# Patient Record
Sex: Male | Born: 1985 | Race: Asian | Hispanic: No | Marital: Single | Smoking: Never smoker
Health system: Southern US, Community
[De-identification: ages and names within clinical notes are randomized; demographics above are authoritative.]

## PROBLEM LIST (undated history)

## (undated) DIAGNOSIS — R55 Syncope and collapse: Secondary | ICD-10-CM

## (undated) HISTORY — DX: Syncope and collapse: R55

## (undated) HISTORY — PX: TYMPANOPLASTY: SHX33

---

## 2010-11-13 ENCOUNTER — Encounter: Payer: Self-pay | Admitting: Internal Medicine

## 2010-11-13 ENCOUNTER — Ambulatory Visit (INDEPENDENT_AMBULATORY_CARE_PROVIDER_SITE_OTHER): Payer: 59 | Admitting: Internal Medicine

## 2010-11-13 DIAGNOSIS — R0609 Other forms of dyspnea: Secondary | ICD-10-CM

## 2010-11-13 DIAGNOSIS — R0989 Other specified symptoms and signs involving the circulatory and respiratory systems: Secondary | ICD-10-CM

## 2010-11-13 DIAGNOSIS — K219 Gastro-esophageal reflux disease without esophagitis: Secondary | ICD-10-CM

## 2010-11-13 DIAGNOSIS — R5383 Other fatigue: Secondary | ICD-10-CM | POA: Insufficient documentation

## 2010-11-13 DIAGNOSIS — R0600 Dyspnea, unspecified: Secondary | ICD-10-CM | POA: Insufficient documentation

## 2010-11-13 LAB — CBC WITH DIFFERENTIAL/PLATELET
Basophils Absolute: 0 10*3/uL (ref 0.0–0.1)
Basophils Relative: 1 % (ref 0–1)
Eosinophils Absolute: 0.1 10*3/uL (ref 0.0–0.7)
MCH: 29.4 pg (ref 26.0–34.0)
MCHC: 34.1 g/dL (ref 30.0–36.0)
Neutro Abs: 3.3 10*3/uL (ref 1.7–7.7)
Neutrophils Relative %: 62 % (ref 43–77)
Platelets: 283 10*3/uL (ref 150–400)
RDW: 12 % (ref 11.5–15.5)

## 2010-11-13 LAB — HEPATIC FUNCTION PANEL
AST: 21 U/L (ref 0–37)
Albumin: 5 g/dL (ref 3.5–5.2)
Total Bilirubin: 0.6 mg/dL (ref 0.3–1.2)

## 2010-11-13 LAB — BASIC METABOLIC PANEL
BUN: 15 mg/dL (ref 6–23)
CO2: 30 mEq/L (ref 19–32)
Calcium: 9.5 mg/dL (ref 8.4–10.5)
Chloride: 101 mEq/L (ref 96–112)
Creat: 1 mg/dL (ref 0.50–1.35)

## 2010-11-13 MED ORDER — OMEPRAZOLE 40 MG PO CPDR: 40.0000 mg | DELAYED_RELEASE_CAPSULE | Freq: Every day | ORAL | Status: DC

## 2010-11-13 NOTE — Assessment & Plan Note (Signed)
Obtain chest x ray
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
## 2010-11-13 NOTE — Assessment & Plan Note (Signed)
Obtain CBC, Chem-7, LFT and TSH

## 2010-11-13 NOTE — Assessment & Plan Note (Signed)
Begin omeprazole 40 mg nightly. Limit caffeine intake. Followup in three weeks or sooner if necessary.

## 2010-11-16 ENCOUNTER — Telehealth: Payer: Self-pay

## 2010-11-16 NOTE — Telephone Encounter (Signed)
Message copied by Beverely Low on Mon Nov 16, 2010  4:32 PM ------      Message from: Staci Righter      Created: Sat Nov 14, 2010  7:20 AM       Labs nl

## 2010-11-16 NOTE — Telephone Encounter (Signed)
Pt.notified

## 2010-12-04 ENCOUNTER — Ambulatory Visit: Payer: 59 | Admitting: Internal Medicine

## 2014-04-10 ENCOUNTER — Ambulatory Visit (INDEPENDENT_AMBULATORY_CARE_PROVIDER_SITE_OTHER): Payer: Self-pay | Admitting: Emergency Medicine

## 2014-04-10 VITALS — BP 122/74 | HR 113 | Temp 98.0°F | Resp 17 | Ht 60.0 in | Wt 116.0 lb

## 2014-04-10 DIAGNOSIS — J018 Other acute sinusitis: Secondary | ICD-10-CM

## 2014-04-10 MED ORDER — PSEUDOEPHEDRINE-GUAIFENESIN ER 60-600 MG PO TB12: 1.0000 | ORAL_TABLET | Freq: Two times a day (BID) | ORAL | Status: DC

## 2014-04-10 MED ORDER — AMOXICILLIN-POT CLAVULANATE 875-125 MG PO TABS: 1.0000 | ORAL_TABLET | Freq: Two times a day (BID) | ORAL | Status: DC

## 2014-04-10 NOTE — Progress Notes (Signed)
[facility] [address], [place] [id number] [phone]  Date:  04/10/2014   Name:  Keith Hines   DOB:  1986-01-24   MRN:  [id number]  PCP:  Letitia LibraHodgin Jr, Ala Dachhomas Whitson, MD    Chief Complaint: Chills, fever last night, Headache and Sinusitis   History of Present Illness:  Keith MoSdon Dearmas is a 28 y.o. very pleasant male patient who presents with the following:  3 days history of nasal congestion and drainage.  Post nasal purulent drainage.  Pain in frontal regions. Sore throat.  No cough, wheezing or shortness of breath. Ears feel stuffy and has pain. No  Chills but fever and night sweats Developed headache in right parietal region when sinus symptoms began No neuro or visual symptoms.  Headache is pulsating and interferes with sleep. Feels "bad" No improvement with over the counter medications or other home remedies.  Denies other complaint or health concern today.    Patient Active Problem List   Diagnosis Date Noted  . GERD (gastroesophageal reflux disease) 11/13/2010  . Fatigue 11/13/2010  . Dyspnea 11/13/2010    Past Medical History  Diagnosis Date  . Syncope     History reviewed. No pertinent past surgical history.  History  Substance Use Topics  . Smoking status: Never Smoker   . Smokeless tobacco: Not on file  . Alcohol Use: No    Family History  Problem Relation Age of Onset  . Arthritis Mother   . Arthritis Father   . Hyperlipidemia Father     No Known Allergies  Medication list has been reviewed and updated.  Current Outpatient Prescriptions on File Prior to Visit  Medication Sig Dispense Refill  . Cholecalciferol (VITAMIN D) 1000 UNITS capsule Take 1,000 Units by mouth daily.        . fish oil-omega-3 fatty acids 1000 MG capsule Take 1 g by mouth daily.         No current facility-administered medications on file prior to visit.    Review of Systems:  As per HPI, otherwise negative.    Physical Examination: Filed  Vitals:   04/10/14 1130  BP: 122/74  Pulse: 113  Temp: 98 F (36.7 C)  Resp: 17   Filed Vitals:   04/10/14 1130  Height: 5' (1.524 m)  Weight: 116 lb (52.617 kg)   Body mass index is 22.65 kg/(m^2). Ideal Body Weight: Weight in (lb) to have BMI = 25: 127.7  GEN: WDWN, NAD, Non-toxic, A & O x 3 HEENT: Atraumatic, Normocephalic. Neck supple. No masses, No LAD. Ears and Nose: No external deformity. CV: RRR, No M/G/R. No JVD. No thrill. No extra heart sounds. PULM: CTA B, no wheezes, crackles, rhonchi. No retractions. No resp. distress. No accessory muscle use. ABD: S, NT, ND, +BS. No rebound. No HSM. EXTR: No c/c/e NEURO Normal gait.  PSYCH: Normally interactive. Conversant. Not depressed or anxious appearing.  Calm demeanor.    Assessment and Plan: Sinusitis augmentin mucinex d  Signed,  Phillips OdorJeffery Josep Luviano, MD

## 2014-04-10 NOTE — Patient Instructions (Signed)

## 2014-04-12 ENCOUNTER — Emergency Department (HOSPITAL_COMMUNITY)
Admission: EM | Admit: 2014-04-12 | Discharge: 2014-04-12 | Disposition: A | Discharge status: Home / Self Care | Payer: Commercial Indemnity | Attending: Emergency Medicine | Admitting: Emergency Medicine

## 2014-04-12 ENCOUNTER — Encounter (HOSPITAL_COMMUNITY): Payer: Self-pay | Admitting: Emergency Medicine

## 2014-04-12 DIAGNOSIS — R000 Tachycardia, unspecified: Secondary | ICD-10-CM | POA: Diagnosis not present

## 2014-04-12 DIAGNOSIS — R0981 Nasal congestion: Secondary | ICD-10-CM

## 2014-04-12 DIAGNOSIS — R51 Headache: Secondary | ICD-10-CM | POA: Diagnosis not present

## 2014-04-12 DIAGNOSIS — Z792 Long term (current) use of antibiotics: Secondary | ICD-10-CM | POA: Diagnosis not present

## 2014-04-12 DIAGNOSIS — H9209 Otalgia, unspecified ear: Secondary | ICD-10-CM | POA: Insufficient documentation

## 2014-04-12 DIAGNOSIS — J069 Acute upper respiratory infection, unspecified: Secondary | ICD-10-CM | POA: Diagnosis not present

## 2014-04-12 DIAGNOSIS — R61 Generalized hyperhidrosis: Secondary | ICD-10-CM | POA: Diagnosis not present

## 2014-04-12 DIAGNOSIS — R519 Headache, unspecified: Secondary | ICD-10-CM

## 2014-04-12 DIAGNOSIS — R630 Anorexia: Secondary | ICD-10-CM | POA: Insufficient documentation

## 2014-04-12 DIAGNOSIS — Z7952 Long term (current) use of systemic steroids: Secondary | ICD-10-CM | POA: Diagnosis not present

## 2014-04-12 DIAGNOSIS — Z791 Long term (current) use of non-steroidal anti-inflammatories (NSAID): Secondary | ICD-10-CM | POA: Diagnosis not present

## 2014-04-12 MED ORDER — NAPROXEN 500 MG PO TABS: 500.0000 mg | ORAL_TABLET | Freq: Two times a day (BID) | ORAL | Status: DC

## 2014-04-12 MED ORDER — NAPROXEN 500 MG PO TABS
500.0000 mg | ORAL_TABLET | Freq: Once | ORAL | Status: AC
  Administered 2014-04-12: 500 mg via ORAL
  Filled 2014-04-12: qty 1

## 2014-04-12 MED ORDER — SALINE SPRAY 0.65 % NA SOLN
1.0000 | NASAL | Status: DC | PRN
  Administered 2014-04-12: 1 via NASAL
  Filled 2014-04-12: qty 44

## 2014-04-12 NOTE — ED Provider Notes (Signed)
Medical screening examination/treatment/procedure(s) were performed by non-physician practitioner and as supervising physician I was immediately available for consultation/collaboration.   EKG Interpretation None       Olivia Mackielga M Lamir Racca, MD 04/12/14 2101

## 2014-04-12 NOTE — ED Notes (Signed)
Patient reports he started feeling badly on Monday, states he almost passed out, patient went to UC on Wednesday, was diagnosed with sinus infection and started amoxicillin and mucinex. Pt states he was told to come back if he wasn't feeling better. Patient states he has been having a fever (100.3 at home) patient states at 0340 he took ibuprofen PM. Patient is afebrile on arrival to ER. Patient c/o headache, states he feels hot, and has increased pain in his head when he swallows. Patient reports blood streaked mucous when he blows his nose and decreased hearing "it feels like I am underwater", and difficulty sleeping.

## 2014-04-12 NOTE — ED Provider Notes (Signed)
CSN: [id number]     Arrival date & time 04/12/14  0444 History   First MD Initiated Contact with Patient 04/12/14 [phone]     Chief Complaint  Patient presents with  . URI    fever, headache, chest congestion     (Consider location/radiation/quality/duration/timing/severity/associated sxs/prior Treatment) HPI Pt is a 28yo male presenting to ED with c/o headache, nasal congestion with blood streaked mucous, sore throat, and bilateral ear pressure.  Pt states he started to feel bad about 4 days ago, was seen by UC on Wednesday, 10/28, dx with sinusitis and discharged home with augmentin and mucinex D.  Pt states he has been taking his medications as prescribed however, he still has a moderately severe, intermittent, frontal headache that is worse at night and worse when he tries to swallow. He was advised by UC to come back if he wasn't feeling better.  Pt reports fever Tmax 100.3 just PTA.  States he took ibuprofen PM which does provide moderate relief for his headaches.  Does c/o mild sore throat. Denies SOB, chest pain or cough.  No hx of asthma.   Past Medical History  Diagnosis Date  . Syncope    Past Surgical History  Procedure Laterality Date  . Tympanoplasty     Family History  Problem Relation Age of Onset  . Arthritis Mother   . Arthritis Father   . Hyperlipidemia Father    History  Substance Use Topics  . Smoking status: Never Smoker   . Smokeless tobacco: Not on file  . Alcohol Use: No    Review of Systems  Constitutional: Positive for fever, chills, diaphoresis and appetite change.  HENT: Positive for congestion, ear pain, rhinorrhea, sinus pressure and sore throat. Negative for trouble swallowing and voice change.   Respiratory: Negative for cough and shortness of breath.   Cardiovascular: Negative for chest pain and palpitations.  Gastrointestinal: Negative for nausea, vomiting, abdominal pain and diarrhea.  All other systems reviewed and are  negative.     Allergies  Review of patient's allergies indicates no known allergies.  Home Medications   Prior to Admission medications   Medication Sig Start Date End Date Taking? Authorizing Provider  amoxicillin-clavulanate (AUGMENTIN) 875-125 MG per tablet Take 1 tablet by mouth 2 (two) times daily. 04/10/14  Yes Carmelina DaneJeffery S Anderson, MD  fluticasone (FLONASE) 50 MCG/ACT nasal spray Place 1 spray into both nostrils daily as needed for allergies or rhinitis.   Yes Historical Provider, MD  Ibuprofen-Diphenhydramine Cit (IBUPROFEN PM) 200-38 MG TABS Take 1 tablet by mouth at bedtime as needed (sleep/ pain).   Yes Historical Provider, MD  Multiple Vitamin (MULTIVITAMIN WITH MINERALS) TABS tablet Take 1 tablet by mouth daily.   Yes Historical Provider, MD  pseudoephedrine-guaifenesin (MUCINEX D) 60-600 MG per tablet Take 1 tablet by mouth every 12 (twelve) hours. 04/10/14 04/10/15 Yes Carmelina DaneJeffery S Anderson, MD  naproxen (NAPROSYN) 500 MG tablet Take 1 tablet (500 mg total) by mouth 2 (two) times daily. 04/12/14   Junius FinnerErin O'Malley, PA-C   BP 109/64  Pulse 92  Temp(Src) 98.7 F (37.1 C) (Oral)  Resp 16  Ht 5' (1.524 m)  Wt 115 lb (52.164 kg)  BMI 22.46 kg/m2  SpO2 100% Physical Exam  Nursing note and vitals reviewed. Constitutional: He appears well-developed and well-nourished.  Pt sitting in exam bed, NAD. Non-toxic appearing.  HENT:  Head: Normocephalic and atraumatic.  Right Ear: Hearing, tympanic membrane, external ear and ear canal normal.  Left Ear: Hearing, tympanic  membrane, external ear and ear canal normal.  Nose: Right sinus exhibits maxillary sinus tenderness and frontal sinus tenderness. Left sinus exhibits maxillary sinus tenderness and frontal sinus tenderness.  Mouth/Throat: Uvula is midline, oropharynx is clear and moist and mucous membranes are normal.  Nose-dried nasal mucosal with scant bright red blood in left nostril.  Eyes: Conjunctivae are normal. No scleral  icterus.  Neck: Normal range of motion. Neck supple.  No nuchal rigidity or meningeal signs.  Cardiovascular: Regular rhythm and normal heart sounds.   Pt tachycardic per triage notes, regular rate and rhythm on exam.  Pulmonary/Chest: Effort normal and breath sounds normal. No respiratory distress. He has no wheezes. He has no rales. He exhibits no tenderness.  No respiratory distress, able to speak in full sentences w/o difficulty. Lungs: CTAB  Abdominal: Soft. Bowel sounds are normal. He exhibits no distension and no mass. There is no tenderness. There is no rebound and no guarding.  Musculoskeletal: Normal range of motion.  Neurological: He is alert.  Skin: Skin is warm and dry.    ED Course  Procedures (including critical care time) Labs Review Labs Reviewed - No data to display  Imaging Review No results found.   EKG Interpretation None      MDM   Final diagnoses:  Acute nonintractable headache, unspecified headache type  Nasal congestion  URI (upper respiratory infection)    Pt is a 28yo male presenting to ED with c/o not feeling better after started on Augmentin 2 days ago for sinusitis after being seen at an UC.  Pt has been sick for a total of 4 days.  Symptoms likely viral in nature, however, strongly encouraged pt to continue antibiotics as well as get plenty of rest and stay well hydrated. Discussed cool mist humidifier as well as propping himself up on pillows at night to help him sleep. Discussed with pt that he is on the appropriate antibiotic, no CXR indicated at this time as pt denies cough and Lungs: CTAB.  Rx: Ocean Saline nasal spray and naprosyn. Home care instructions provided.   Return precautions provided. Pt verbalized understanding and agreement with tx plan.     Junius FinnerErin O'Malley, PA-C 04/12/14 [phone]

## 2014-07-24 ENCOUNTER — Ambulatory Visit (INDEPENDENT_AMBULATORY_CARE_PROVIDER_SITE_OTHER): Payer: Commercial Indemnity | Admitting: Family Medicine

## 2014-07-24 VITALS — BP 120/84 | HR 74 | Temp 98.4°F | Resp 16 | Ht 60.0 in | Wt 117.6 lb

## 2014-07-24 DIAGNOSIS — K219 Gastro-esophageal reflux disease without esophagitis: Secondary | ICD-10-CM

## 2014-07-24 DIAGNOSIS — H6982 Other specified disorders of Eustachian tube, left ear: Secondary | ICD-10-CM

## 2014-07-24 DIAGNOSIS — R0981 Nasal congestion: Secondary | ICD-10-CM

## 2014-07-24 DIAGNOSIS — H6690 Otitis media, unspecified, unspecified ear: Secondary | ICD-10-CM

## 2014-07-24 DIAGNOSIS — H6502 Acute serous otitis media, left ear: Secondary | ICD-10-CM

## 2014-07-24 DIAGNOSIS — R109 Unspecified abdominal pain: Secondary | ICD-10-CM

## 2014-07-24 LAB — POCT CBC
GRANULOCYTE PERCENT: 52.8 % (ref 37–80)
HCT, POC: 50.4 % (ref 43.5–53.7)
HEMOGLOBIN: 16.2 g/dL (ref 14.1–18.1)
Lymph, poc: 2.4 (ref 0.6–3.4)
MCH: 29.1 pg (ref 27–31.2)
MCHC: 32.1 g/dL (ref 31.8–35.4)
MCV: 90.7 fL (ref 80–97)
MID (CBC): 0.1 (ref 0–0.9)
MPV: 8.7 fL (ref 0–99.8)
PLATELET COUNT, POC: 261 10*3/uL (ref 142–424)
POC Granulocyte: 2.9 (ref 2–6.9)
POC LYMPH PERCENT: 44.7 %L (ref 10–50)
POC MID %: 2.5 % (ref 0–12)
RBC: 5.55 M/uL (ref 4.69–6.13)
RDW, POC: 13.4 %
WBC: 5.4 10*3/uL (ref 4.6–10.2)

## 2014-07-24 MED ORDER — AMOXICILLIN 875 MG PO TABS: 875.0000 mg | ORAL_TABLET | Freq: Two times a day (BID) | ORAL | Status: DC

## 2014-07-24 MED ORDER — FLUTICASONE PROPIONATE 50 MCG/ACT NA SUSP: 2.0000 | Freq: Every day | NASAL | Status: AC

## 2014-07-24 NOTE — Patient Instructions (Signed)
Try flonase nasal spray once per day, over the counter zyrtec or allegra once per day for possible allergies and chronic sinus symptoms. These may help pressure and clicking in ears as well.  Amoxicillin for early ear infection on left ear.  This may also help sinuses. If sinus and ear symptoms not improving in next few weeks - we can refer you to Ear Nose and Throat specialist.   For heartburn - you are taking appropriate dose of omeprazole -  once per day. Avoid foods below that may worsen symptoms. If your heartburn is not improving in next 1 to 2 weeks - return to discuss other options or workup. Return to the clinic or go to the nearest emergency room if any of your symptoms worsen or new symptoms occur.  Food Choices for Gastroesophageal Reflux Disease When you have gastroesophageal reflux disease (GERD), the foods you eat and your eating habits are very important. Choosing the right foods can help ease the discomfort of GERD. WHAT GENERAL GUIDELINES DO I NEED TO FOLLOW?  Choose fruits, vegetables, whole grains, low-fat dairy products, and low-fat meat, fish, and poultry.  Limit fats such as oils, salad dressings, butter, nuts, and avocado.  Keep a food diary to identify foods that cause symptoms.  Avoid foods that cause reflux. These may be different for different people.  Eat frequent small meals instead of three large meals each day.  Eat your meals slowly, in a relaxed setting.  Limit fried foods.  Cook foods using methods other than frying.  Avoid drinking alcohol.  Avoid drinking large amounts of liquids with your meals.  Avoid bending over or lying down until 2-3 hours after eating. WHAT FOODS ARE NOT RECOMMENDED? The following are some foods and drinks that may worsen your symptoms: Vegetables Tomatoes. Tomato juice. Tomato and spaghetti sauce. Chili peppers. Onion and garlic. Horseradish. Fruits Oranges, grapefruit, and lemon (fruit and juice). Meats High-fat  meats, fish, and poultry. This includes hot dogs, ribs, ham, sausage, salami, and bacon. Dairy Whole milk and chocolate milk. Sour cream. Cream. Butter. Ice cream. Cream cheese.  Beverages Coffee and tea, with or without caffeine. Carbonated beverages or energy drinks. Condiments Hot sauce. Barbecue sauce.  Sweets/Desserts Chocolate and cocoa. Donuts. Peppermint and spearmint. Fats and Oils High-fat foods, including [place] fries and potato chips. Other Vinegar. Strong spices, such as black pepper, white pepper, red pepper, cayenne, curry powder, cloves, ginger, and chili powder. The items listed above may not be a complete list of foods and beverages to avoid. Contact your dietitian for more information. Document Released: 05/31/2005 Document Revised: 06/05/2013 Document Reviewed: 04/04/2013 [facility] Patient Information 2015 [place], [place]. This information is not intended to replace advice given to you by your health care provider. Make sure you discuss any questions you have with your health care provider.  Barotitis Media Barotitis media is inflammation of your middle ear. This occurs when the auditory tube (eustachian tube) leading from the back of your nose (nasopharynx) to your eardrum is blocked. This blockage may result from a cold, environmental allergies, or an upper respiratory infection. Unresolved barotitis media may lead to damage or hearing loss (barotrauma), which may become permanent. HOME CARE INSTRUCTIONS   Use medicines as recommended by your health care provider. Over-the-counter medicines will help unblock the canal and can help during times of air travel.  Do not put anything into your ears to clean or unplug them. Eardrops will not be helpful.  Do not swim, dive, or fly until  your health care provider says it is all right to do so. If these activities are necessary, chewing gum with frequent, forceful swallowing may help. It is also helpful to hold your nose and  gently blow to pop your ears for equalizing pressure changes. This forces air into the eustachian tube.  Only take over-the-counter or prescription medicines for pain, discomfort, or fever as directed by your health care provider.  A decongestant may be helpful in decongesting the middle ear and make pressure equalization easier. SEEK MEDICAL CARE IF:  You experience a serious form of dizziness in which you feel as if the room is spinning and you feel nauseated (vertigo).  Your symptoms only involve one ear. SEEK IMMEDIATE MEDICAL CARE IF:   You develop a severe headache, dizziness, or severe ear pain.  You have bloody or pus-like drainage from your ears.  You develop a fever.  Your problems do not improve or become worse. MAKE SURE YOU:   Understand these instructions.  Will watch your condition.  Will get help right away if you are not doing well or get worse. Document Released: 05/28/2000 Document Revised: 03/21/2013 Document Reviewed: 12/26/2012 [facility] Patient Information 2015 [place], [place]. This information is not intended to replace advice given to you by your health care provider. Make sure you discuss any questions you have with your health care provider.

## 2014-07-24 NOTE — Progress Notes (Addendum)
Subjective:    Patient ID: Keith Hines, male    DOB: 15-Dec-1985, 29 y.o.   MRN: [id number]  HPI Chief Complaint  Patient presents with  . Abdominal Pain    x 1 1/2 weeks   . Headache  . Bitter taste in mouth  . Stuffy Nose    " as long as he can rememenber "   This chart was scribed for Meredith StaggersJeffrey Iria Jamerson, MD by Andrew Auaven Small, ED Scribe. This patient was seen in room 1 and the patient's care was started at 8:28 PM.  HPI Comments: Keith MoSdon Hanawalt is a 29 y.o. male who presents to the [facility] complaining of abdominal pain that began 1.5 weeks ago. Pt states he has not been eating well due to abdominal pain. Pt denies drinking alcohol often but states he drank an increased amount of beer 1.5 weeks ago. He has also had some dizziness. Pt began taking acid blocker 3 days ago without improvement. Pt's last normal BM h last b 4 hours ago. Pt states he has some bleeding with wiping, last being 2 days ago. Pt states he has been seen in the past and was told he has an anal fissure. Pt denies hard stools. He takes ibuprofen occasionally to help him sleep.  Pt works at a [employer]. He denies heavy lifting and exercise. Pt denies nausea and emesis.  Pt also c/o a bitter/sour taste in his mouth. Pt states he has been drinking an increased amount of coffee beginning 3 months ago. Pt states he drinks about 1-2 cups daily and if not coffee he drinks tea.   Sinus symptoms- pt reports chronic congestion often. He reports symptoms worsen after developing a HA 1 week ago. Pt has been blowing yellow mucous. Pt began taking allergy medication about 2 days ago. Pt has also had left ear fullness without drainage and loss of hearing. Pt used to use nasal spray once a day in that past. Pt denies fever, cough, sneezing, rhinorrhea and weight loss.   Patient Active Problem List   Diagnosis Date Noted  . GERD (gastroesophageal reflux disease) 11/13/2010  . Fatigue 11/13/2010  . Dyspnea 11/13/2010   Past  Medical History  Diagnosis Date  . Syncope    Past Surgical History  Procedure Laterality Date  . Tympanoplasty     No Known Allergies Prior to Admission medications   Medication Sig Start Date End Date Taking? Authorizing Provider  Ibuprofen-Diphenhydramine Cit (IBUPROFEN PM) 200-38 MG TABS Take 1 tablet by mouth at bedtime as needed (sleep/ pain).   Yes Historical Provider, MD  omeprazole (PRILOSEC) 20 MG capsule Take 20 mg by mouth daily.   Yes Historical Provider, MD  amoxicillin-clavulanate (AUGMENTIN) 875-125 MG per tablet Take 1 tablet by mouth 2 (two) times daily. Patient not taking: Reported on 07/24/2014 04/10/14   Carmelina DaneJeffery S Anderson, MD  fluticasone [facility](FLONASE) 50 MCG/ACT nasal spray Place 1 spray into both nostrils daily as needed for allergies or rhinitis.    Historical Provider, MD  Multiple Vitamin (MULTIVITAMIN WITH MINERALS) TABS tablet Take 1 tablet by mouth daily.    Historical Provider, MD  naproxen (NAPROSYN) 500 MG tablet Take 1 tablet (500 mg total) by mouth 2 (two) times daily. Patient not taking: Reported on 07/24/2014 04/12/14   Junius FinnerErin O'Malley, PA-C  pseudoephedrine-guaifenesin [facility](MUCINEX D) 60-600 MG per tablet Take 1 tablet by mouth every 12 (twelve) hours. Patient not taking: Reported on 07/24/2014 04/10/14 04/10/15  Carmelina DaneJeffery S Anderson, MD  History   Social History  . Marital Status: Single    Spouse Name: N/A  . Number of Children: N/A  . Years of Education: N/A   Occupational History  . Not on file.   Social History Main Topics  . Smoking status: Never Smoker   . Smokeless tobacco: Not on file  . Alcohol Use: No  . Drug Use: No  . Sexual Activity: No   Other Topics Concern  . Not on file   Social History Narrative   Review of Systems  Constitutional: Negative for fever, chills and unexpected weight change.  HENT: Positive for congestion and ear pain. Negative for rhinorrhea, sinus pressure and sneezing.   Gastrointestinal: Positive for abdominal  pain. Negative for nausea and vomiting.  Neurological: Positive for headaches.   Objective:   Physical Exam  Constitutional: He is oriented to person, place, and time. He appears well-developed and well-nourished.  HENT:  Head: Normocephalic and atraumatic.  Right Ear: Tympanic membrane, external ear and ear canal normal.  Left Ear: Tympanic membrane, external ear and ear canal normal.  Nose: Mucosal edema present. No rhinorrhea. Right sinus exhibits no maxillary sinus tenderness and no frontal sinus tenderness. Left sinus exhibits no maxillary sinus tenderness and no frontal sinus tenderness.  Mouth/Throat: Mucous membranes are normal. Posterior oropharyngeal erythema ( minimal) present. No oropharyngeal exudate.  Left TM has some scarring, clear fluid and minimal injection superiorly.Right TM is pearly grey. Edematous turbinates bilaterally.   Eyes: Conjunctivae are normal. Pupils are equal, round, and reactive to light.  Neck: Neck supple.  Cardiovascular: Normal rate, regular rhythm, normal heart sounds and intact distal pulses.   No murmur heard. Pulmonary/Chest: Effort normal and breath sounds normal. He has no wheezes. He has no rhonchi. He has no rales.  Abdominal: Soft. He exhibits no distension. There is no tenderness. There is no tenderness at McBurney's point and negative Murphy's sign.  Lymphadenopathy:    He has no cervical adenopathy.  Neurological: He is alert and oriented to person, place, and time.  Skin: Skin is warm and dry. No rash noted.  Psychiatric: He has a normal mood and affect. His behavior is normal.  Vitals reviewed.  Filed Vitals:   07/24/14 2008  BP: 120/84  Pulse: 74  Temp: 98.4 F (36.9 C)  TempSrc: Oral  Resp: 16  Height: 5' (1.524 m)  Weight: 117 lb 9.6 oz (53.343 kg)  SpO2: 98%   Results for orders placed or performed in visit on 07/24/14  POCT CBC  Result Value Ref Range   WBC 5.4 4.6 - 10.2 K/uL   Lymph, poc 2.4 0.6 - 3.4   POC LYMPH  PERCENT 44.7 10 - 50 %L   MID (cbc) 0.1 0 - 0.9   POC MID % 2.5 0 - 12 %M   POC Granulocyte 2.9 2 - 6.9   Granulocyte percent 52.8 37 - 80 %G   RBC 5.55 4.69 - 6.13 M/uL   Hemoglobin 16.2 14.1 - 18.1 g/dL   HCT, POC 16.1 09.6 - 53.7 %   MCV 90.7 80 - 97 fL   MCH, POC 29.1 27 - 31.2 pg   MCHC 32.1 31.8 - 35.4 g/dL   RDW, POC 04.5 %   Platelet Count, POC 261 142 - 424 K/uL   MPV 8.7 0 - 99.8 fL      Assessment & Plan:   Calyb Mcquarrie is a 29 y.o. male Abdominal pain, unspecified abdominal location, Gastroesophageal reflux disease, esophagitis presence  not specified - Plan: POCT CBC, Comprehensive metabolic panel  -abdomen soft, nontender, reassuring CBC. Will check lipase and CMP, but suspected GERD.  Continue omeprazole 20mg  QD - just started few days ago - and trigger avoidance as below. rtc if persists - sooner if worsens.   Eustachian tube dysfunction, left - Plan: POCT CBC, fluticasone (FLONASE) 50 MCG/ACT nasal spray, Ear infection - serous otitis vs. Early AOM, and underlying sinus congestion - Plan: fluticasone (FLONASE) 50 MCG/ACT nasal spray.   - suspected allergic component with ETD, chronic sinusitis and L sided serous otitis vs early AOM .   -Start flonase NS and saline NS otc - correct use discussed.   -zyrtec or allegra otc qd  -amoxicillin for early AOM and sinusitis with new HA  -rtc precautions and consider ENT eval if persistent sx's.    Meds ordered this encounter  Medications  . omeprazole (PRILOSEC) 20 MG capsule    Sig: Take 20 mg by mouth daily.  . fluticasone (FLONASE) 50 MCG/ACT nasal spray    Sig: Place 2 sprays into both nostrils daily.    Dispense:  16 g    Refill:  3   Patient Instructions  Try flonase nasal spray once per day, over the counter zyrtec or allegra once per day for possible allergies and chronic sinus symptoms. These may help pressure and clicking in ears as well.  Amoxicillin for early ear infection on left ear.  This may also help  sinuses. If sinus and ear symptoms not improving in next few weeks - we can refer you to Ear Nose and Throat specialist.   For heartburn - you are taking appropriate dose of omeprazole - 20mg  once per day. Avoid foods below that may worsen symptoms. If your heartburn is not improving in next 1 to 2 weeks - return to discuss other options or workup. Return to the clinic or go to the nearest emergency room if any of your symptoms worsen or new symptoms occur.  Food Choices for Gastroesophageal Reflux Disease When you have gastroesophageal reflux disease (GERD), the foods you eat and your eating habits are very important. Choosing the right foods can help ease the discomfort of GERD. WHAT GENERAL GUIDELINES DO I NEED TO FOLLOW?  Choose fruits, vegetables, whole grains, low-fat dairy products, and low-fat meat, fish, and poultry.  Limit fats such as oils, salad dressings, butter, nuts, and avocado.  Keep a food diary to identify foods that cause symptoms.  Avoid foods that cause reflux. These may be different for different people.  Eat frequent small meals instead of three large meals each day.  Eat your meals slowly, in a relaxed setting.  Limit fried foods.  Cook foods using methods other than frying.  Avoid drinking alcohol.  Avoid drinking large amounts of liquids with your meals.  Avoid bending over or lying down until 2-3 hours after eating. WHAT FOODS ARE NOT RECOMMENDED? The following are some foods and drinks that may worsen your symptoms: Vegetables Tomatoes. Tomato juice. Tomato and spaghetti sauce. Chili peppers. Onion and garlic. Horseradish. Fruits Oranges, grapefruit, and lemon (fruit and juice). Meats High-fat meats, fish, and poultry. This includes hot dogs, ribs, ham, sausage, salami, and bacon. Dairy Whole milk and chocolate milk. Sour cream. Cream. Butter. Ice cream. Cream cheese.  Beverages Coffee and tea, with or without caffeine. Carbonated beverages or  energy drinks. Condiments Hot sauce. Barbecue sauce.  Sweets/Desserts Chocolate and cocoa. Donuts. Peppermint and spearmint. Fats and Oils High-fat foods, including [place]  fries and potato chips. Other Vinegar. Strong spices, such as black pepper, white pepper, red pepper, cayenne, curry powder, cloves, ginger, and chili powder. The items listed above may not be a complete list of foods and beverages to avoid. Contact your dietitian for more information. Document Released: 05/31/2005 Document Revised: 06/05/2013 Document Reviewed: 04/04/2013 [facility] Patient Information 2015 [place], [place]. This information is not intended to replace advice given to you by your health care provider. Make sure you discuss any questions you have with your health care provider.  Barotitis Media Barotitis media is inflammation of your middle ear. This occurs when the auditory tube (eustachian tube) leading from the back of your nose (nasopharynx) to your eardrum is blocked. This blockage may result from a cold, environmental allergies, or an upper respiratory infection. Unresolved barotitis media may lead to damage or hearing loss (barotrauma), which may become permanent. HOME CARE INSTRUCTIONS   Use medicines as recommended by your health care provider. Over-the-counter medicines will help unblock the canal and can help during times of air travel.  Do not put anything into your ears to clean or unplug them. Eardrops will not be helpful.  Do not swim, dive, or fly until your health care provider says it is all right to do so. If these activities are necessary, chewing gum with frequent, forceful swallowing may help. It is also helpful to hold your nose and gently blow to pop your ears for equalizing pressure changes. This forces air into the eustachian tube.  Only take over-the-counter or prescription medicines for pain, discomfort, or fever as directed by your health care provider.  A decongestant may be  helpful in decongesting the middle ear and make pressure equalization easier. SEEK MEDICAL CARE IF:  You experience a serious form of dizziness in which you feel as if the room is spinning and you feel nauseated (vertigo).  Your symptoms only involve one ear. SEEK IMMEDIATE MEDICAL CARE IF:   You develop a severe headache, dizziness, or severe ear pain.  You have bloody or pus-like drainage from your ears.  You develop a fever.  Your problems do not improve or become worse. MAKE SURE YOU:   Understand these instructions.  Will watch your condition.  Will get help right away if you are not doing well or get worse. Document Released: 05/28/2000 Document Revised: 03/21/2013 Document Reviewed: 12/26/2012 [facility] Patient Information 2015 [place], [place]. This information is not intended to replace advice given to you by your health care provider. Make sure you discuss any questions you have with your health care provider.       I personally performed the services described in this documentation, which was scribed in my presence. The recorded information has been reviewed and considered, and addended by me as needed.

## 2014-07-25 LAB — COMPREHENSIVE METABOLIC PANEL
ALT: 40 U/L (ref 0–53)
AST: 22 U/L (ref 0–37)
Albumin: 4.7 g/dL (ref 3.5–5.2)
Alkaline Phosphatase: 45 U/L (ref 39–117)
BILIRUBIN TOTAL: 0.4 mg/dL (ref 0.2–1.2)
BUN: 13 mg/dL (ref 6–23)
CHLORIDE: 100 meq/L (ref 96–112)
CO2: 29 meq/L (ref 19–32)
Calcium: 9.6 mg/dL (ref 8.4–10.5)
Creat: 0.99 mg/dL (ref 0.50–1.35)
Glucose, Bld: 95 mg/dL (ref 70–99)
Potassium: 4.3 mEq/L (ref 3.5–5.3)
SODIUM: 138 meq/L (ref 135–145)
TOTAL PROTEIN: 8 g/dL (ref 6.0–8.3)

## 2014-07-25 LAB — LIPASE: Lipase: 76 U/L — ABNORMAL HIGH (ref 0–75)

## 2014-07-31 ENCOUNTER — Ambulatory Visit (INDEPENDENT_AMBULATORY_CARE_PROVIDER_SITE_OTHER): Payer: Commercial Indemnity | Admitting: Family Medicine

## 2014-07-31 VITALS — BP 108/72 | HR 83 | Temp 98.2°F | Resp 24 | Ht 59.5 in | Wt 117.4 lb

## 2014-07-31 DIAGNOSIS — R748 Abnormal levels of other serum enzymes: Secondary | ICD-10-CM

## 2014-07-31 DIAGNOSIS — H6692 Otitis media, unspecified, left ear: Secondary | ICD-10-CM

## 2014-07-31 DIAGNOSIS — J019 Acute sinusitis, unspecified: Secondary | ICD-10-CM

## 2014-07-31 MED ORDER — LEVOFLOXACIN 500 MG PO TABS: 500.0000 mg | ORAL_TABLET | Freq: Every day | ORAL | Status: DC

## 2014-07-31 NOTE — Progress Notes (Signed)
Subjective:  This chart was scribed for Keith Staggers MD, by Keith Hines, at [facility].  This patient was seen in room 14 and the patient's care was started at 7:48 PM.    Patient ID: Keith Hines, male    DOB: [birth year], 29 y.o.   MRN: [id number]  HPI  HPI Comments: Keith Hines is a 29 y.o. male who presents to [facility] for a worsening headache, congestion with yellow/white mucous, constant facial pressure (ears and forehead) as well as nausea. Patient can hear out of both ears but he states that he feels like there is fluid coming out of both of them.  He has been compliant with his Amoxicillin (has only missed 1 dose) and did not have any side effects from his Antibiotic.   Patient is using his Flonase as well but does not think that it is helping him.   Notes that his abdominal pain is better. Patient has not tried salt water nasal spray.   Patient was seen 1 week ago. For suspected GERD and Eustachian tube dysfunction. Continued Omeprazole for GERD and Started on Flonase nasal spray, amoxicillin (875 mg BID for 10 days)  for early acute otitis media and sinusitis.       Patient Active Problem List   Diagnosis Date Noted  . GERD (gastroesophageal reflux disease) 11/13/2010  . Fatigue 11/13/2010  . Dyspnea 11/13/2010   Past Medical History  Diagnosis Date  . Syncope    Past Surgical History  Procedure Laterality Date  . Tympanoplasty     No Known Allergies Prior to Admission medications   Medication Sig Start Date End Date Taking? Authorizing Provider  amoxicillin (AMOXIL) 875 MG tablet Take 1 tablet (875 mg total) by mouth 2 (two) times daily. 07/24/14  Yes Shade Flood, MD  fluticasone (FLONASE) 50 MCG/ACT nasal spray Place 2 sprays into both nostrils daily. 07/24/14  Yes Shade Flood, MD  omeprazole (PRILOSEC) 20 MG capsule Take 20 mg by mouth daily.   Yes Historical Provider, MD  Ibuprofen-Diphenhydramine Cit  (IBUPROFEN PM) 200-38 MG TABS Take 1 tablet by mouth at bedtime as needed (sleep/ pain).    Historical Provider, MD  Multiple Vitamin (MULTIVITAMIN WITH MINERALS) TABS tablet Take 1 tablet by mouth daily.    Historical Provider, MD   History   Social History  . Marital Status: Single    Spouse Name: N/A  . Number of Children: N/A  . Years of Education: N/A   Occupational History  . Not on file.   Social History Main Topics  . Smoking status: Never Smoker   . Smokeless tobacco: Never Used  . Alcohol Use: No  . Drug Use: No  . Sexual Activity: No   Other Topics Concern  . Not on file   Social History Narrative         Review of Systems  HENT: Positive for congestion, ear pain and sinus pressure. Negative for hearing loss.   Respiratory: Negative for shortness of breath.   Cardiovascular: Negative for chest pain.  Gastrointestinal: Positive for nausea. Negative for vomiting and abdominal pain.  Neurological: Positive for headaches.       Objective:   Physical Exam  Constitutional: He is oriented to person, place, and time. He appears well-developed and well-nourished.  HENT:  Head: Normocephalic and atraumatic.  Right Ear: Tympanic membrane and ear canal normal.  Left Ear: Tympanic membrane and ear canal normal.  Nose: No rhinorrhea.  Mouth/Throat: Oropharynx is clear and moist and mucous membranes are normal. No oropharyngeal exudate or posterior oropharyngeal erythema.  Left TM is injected with slight discolored fluid behind the TM, no rupture.   Right TM: pearly gray without any significant fluid.   Minimal edema in the turbinates of the nose The sinuses are non tender.     Eyes: Conjunctivae are normal. Pupils are equal, round, and reactive to light.  No nystagmus    Neck: Neck supple.  Cardiovascular: Normal rate, regular rhythm, normal heart sounds and intact distal pulses.   No murmur heard. Pulmonary/Chest: Effort normal and breath sounds normal.  He has no wheezes. He has no rhonchi. He has no rales.  Abdominal: Soft. There is no tenderness.  Lymphadenopathy:    He has no cervical adenopathy.  Neurological: He is alert and oriented to person, place, and time.  Negative romberg No pronator drift      Skin: Skin is warm and dry. No rash noted.  Psychiatric: He has a normal mood and affect. His behavior is normal.  Vitals reviewed.   Filed Vitals:   07/31/14 1849  BP: 108/72  Pulse: 83  Temp: 98.2 F (36.8 C)  TempSrc: Oral  Resp: 24  Height: 4' 11.5" (1.511 m)  Weight: 117 lb 6 oz (53.241 kg)  SpO2: 96%         Assessment & Plan:   Keith Hines is a 29 y.o. male Elevated lipase - Plan: Lipase  Acute sinusitis, recurrence not specified, unspecified location - Plan: levofloxacin (LEVAQUIN) 500 MG tablet  Acute left otitis media, recurrence not specified, unspecified otitis media type - Plan: levofloxacin (LEVAQUIN) 500 MG tablet  Otitis in setting of already taking amoxicillin, and sx's of sinusitis. Start levaquin (SED and tendon risks discussed), sx care with fluids, saline NS, rtc precautions.   Meds ordered this encounter  Medications  . levofloxacin (LEVAQUIN) 500 MG tablet    Sig: Take 1 tablet (500 mg total) by mouth daily.    Dispense:  10 tablet    Refill:  0   Patient Instructions  Stop amoxicillin, start levaquin for sinusitis, and persistent left ear infection. advil or alleve if needed, Saline nasal spray atleast 4 times per day.  Drink plenty of fluids. If not improving into next week - recheck. Return to the clinic or go to the nearest emergency room if any of your symptoms worsen or new symptoms occur. You should receive a call or letter about your lab results within the next week to 10 days (prior pancreas test borderline).   Otitis Media Otitis media is redness, soreness, and inflammation of the middle ear. Otitis media may be caused by allergies or, most commonly, by infection. Often it occurs  as a complication of the common cold. SIGNS AND SYMPTOMS Symptoms of otitis media may include:  Earache.  Fever.  Ringing in your ear.  Headache.  Leakage of fluid from the ear. DIAGNOSIS To diagnose otitis media, your health care provider will examine your ear with an otoscope. This is an instrument that allows your health care provider to see into your ear in order to examine your eardrum. Your health care provider also will ask you questions about your symptoms. TREATMENT  Typically, otitis media resolves on its own within 3-5 days. Your health care provider may prescribe medicine to ease your symptoms of pain. If otitis media does not resolve within 5 days or is recurrent, your health care provider may prescribe antibiotic medicines if  he or she suspects that a bacterial infection is the cause. HOME CARE INSTRUCTIONS   If you were prescribed an antibiotic medicine, finish it all even if you start to feel better.  Take medicines only as directed by your health care provider.  Keep all follow-up visits as directed by your health care provider. SEEK MEDICAL CARE IF:  You have otitis media only in one ear, or bleeding from your nose, or both.  You notice a lump on your neck.  You are not getting better in 3-5 days.  You feel worse instead of better. SEEK IMMEDIATE MEDICAL CARE IF:   You have pain that is not controlled with medicine.  You have swelling, redness, or pain around your ear or stiffness in your neck.  You notice that part of your face is paralyzed.  You notice that the bone behind your ear (mastoid) is tender when you touch it. MAKE SURE YOU:   Understand these instructions.  Will watch your condition.  Will get help right away if you are not doing well or get worse. Document Released: 03/05/2004 Document Revised: 10/15/2013 Document Reviewed: 12/26/2012 [facility] Patient Information 2015 [place], [place]. This information is not intended to replace advice  given to you by your health care provider. Make sure you discuss any questions you have with your health care provider. Sinusitis Sinusitis is redness, soreness, and inflammation of the paranasal sinuses. Paranasal sinuses are air pockets within the bones of your face (beneath the eyes, the middle of the forehead, or above the eyes). In healthy paranasal sinuses, mucus is able to drain out, and air is able to circulate through them by way of your nose. However, when your paranasal sinuses are inflamed, mucus and air can become trapped. This can allow bacteria and other germs to grow and cause infection. Sinusitis can develop quickly and last only a short time (acute) or continue over a long period (chronic). Sinusitis that lasts for more than 12 weeks is considered chronic.  CAUSES  Causes of sinusitis include:  Allergies.  Structural abnormalities, such as displacement of the cartilage that separates your nostrils (deviated septum), which can decrease the air flow through your nose and sinuses and affect sinus drainage.  Functional abnormalities, such as when the small hairs (cilia) that line your sinuses and help remove mucus do not work properly or are not present. SIGNS AND SYMPTOMS  Symptoms of acute and chronic sinusitis are the same. The primary symptoms are pain and pressure around the affected sinuses. Other symptoms include:  Upper toothache.  Earache.  Headache.  Bad breath.  Decreased sense of smell and taste.  A cough, which worsens when you are lying flat.  Fatigue.  Fever.  Thick drainage from your nose, which often is green and may contain pus (purulent).  Swelling and warmth over the affected sinuses. DIAGNOSIS  Your health care provider will perform a physical exam. During the exam, your health care provider may:  Look in your nose for signs of abnormal growths in your nostrils (nasal polyps).  Tap over the affected sinus to check for signs of  infection.  View the inside of your sinuses (endoscopy) using an imaging device that has a light attached (endoscope). If your health care provider suspects that you have chronic sinusitis, one or more of the following tests may be recommended:  Allergy tests.  Nasal culture. A sample of mucus is taken from your nose, sent to a lab, and screened for bacteria.  Nasal cytology. A  sample of mucus is taken from your nose and examined by your health care provider to determine if your sinusitis is related to an allergy. TREATMENT  Most cases of acute sinusitis are related to a viral infection and will resolve on their own within 10 days. Sometimes medicines are prescribed to help relieve symptoms (pain medicine, decongestants, nasal steroid sprays, or saline sprays).  However, for sinusitis related to a bacterial infection, your health care provider will prescribe antibiotic medicines. These are medicines that will help kill the bacteria causing the infection.  Rarely, sinusitis is caused by a fungal infection. In theses cases, your health care provider will prescribe antifungal medicine. For some cases of chronic sinusitis, surgery is needed. Generally, these are cases in which sinusitis recurs more than 3 times per year, despite other treatments. HOME CARE INSTRUCTIONS   Drink plenty of water. Water helps thin the mucus so your sinuses can drain more easily.  Use a humidifier.  Inhale steam 3 to 4 times a day (for example, sit in the bathroom with the shower running).  Apply a warm, moist washcloth to your face 3 to 4 times a day, or as directed by your health care provider.  Use saline nasal sprays to help moisten and clean your sinuses.  Take medicines only as directed by your health care provider.  If you were prescribed either an antibiotic or antifungal medicine, finish it all even if you start to feel better. SEEK IMMEDIATE MEDICAL CARE IF:  You have increasing pain or severe  headaches.  You have nausea, vomiting, or drowsiness.  You have swelling around your face.  You have vision problems.  You have a stiff neck.  You have difficulty breathing. MAKE SURE YOU:   Understand these instructions.  Will watch your condition.  Will get help right away if you are not doing well or get worse. Document Released: 05/31/2005 Document Revised: 10/15/2013 Document Reviewed: 06/15/2011 [facility] Patient Information 2015 [place], [place]. This information is not intended to replace advice given to you by your health care provider. Make sure you discuss any questions you have with your health care provider.     I personally performed the services described in this documentation, which was scribed in my presence. The recorded information has been reviewed and considered, and addended by me as needed.

## 2014-07-31 NOTE — Patient Instructions (Signed)
Stop amoxicillin, start levaquin for sinusitis, and persistent left ear infection. advil or alleve if needed, Saline nasal spray atleast 4 times per day.  Drink plenty of fluids. If not improving into next week - recheck. Return to the clinic or go to the nearest emergency room if any of your symptoms worsen or new symptoms occur. You should receive a call or letter about your lab results within the next week to 10 days (prior pancreas test borderline).   Otitis Media Otitis media is redness, soreness, and inflammation of the middle ear. Otitis media may be caused by allergies or, most commonly, by infection. Often it occurs as a complication of the common cold. SIGNS AND SYMPTOMS Symptoms of otitis media may include:  Earache.  Fever.  Ringing in your ear.  Headache.  Leakage of fluid from the ear. DIAGNOSIS To diagnose otitis media, your health care provider will examine your ear with an otoscope. This is an instrument that allows your health care provider to see into your ear in order to examine your eardrum. Your health care provider also will ask you questions about your symptoms. TREATMENT  Typically, otitis media resolves on its own within 3-5 days. Your health care provider may prescribe medicine to ease your symptoms of pain. If otitis media does not resolve within 5 days or is recurrent, your health care provider may prescribe antibiotic medicines if he or she suspects that a bacterial infection is the cause. HOME CARE INSTRUCTIONS   If you were prescribed an antibiotic medicine, finish it all even if you start to feel better.  Take medicines only as directed by your health care provider.  Keep all follow-up visits as directed by your health care provider. SEEK MEDICAL CARE IF:  You have otitis media only in one ear, or bleeding from your nose, or both.  You notice a lump on your neck.  You are not getting better in 3-5 days.  You feel worse instead of better. SEEK  IMMEDIATE MEDICAL CARE IF:   You have pain that is not controlled with medicine.  You have swelling, redness, or pain around your ear or stiffness in your neck.  You notice that part of your face is paralyzed.  You notice that the bone behind your ear (mastoid) is tender when you touch it. MAKE SURE YOU:   Understand these instructions.  Will watch your condition.  Will get help right away if you are not doing well or get worse. Document Released: 03/05/2004 Document Revised: 10/15/2013 Document Reviewed: 12/26/2012 [facility] Patient Information 2015 [place]. This information is not intended to replace advice given to you by your health care provider. Make sure you discuss any questions you have with your health care provider. Sinusitis Sinusitis is redness, soreness, and inflammation of the paranasal sinuses. Paranasal sinuses are air pockets within the bones of your face (beneath the eyes, the middle of the forehead, or above the eyes). In healthy paranasal sinuses, mucus is able to drain out, and air is able to circulate through them by way of your nose. However, when your paranasal sinuses are inflamed, mucus and air can become trapped. This can allow bacteria and other germs to grow and cause infection. Sinusitis can develop quickly and last only a short time (acute) or continue over a long period (chronic). Sinusitis that lasts for more than 12 weeks is considered chronic.  CAUSES  Causes of sinusitis include:  Allergies.  Structural abnormalities, such as displacement of the cartilage that separates  your nostrils (deviated septum), which can decrease the air flow through your nose and sinuses and affect sinus drainage.  Functional abnormalities, such as when the small hairs (cilia) that line your sinuses and help remove mucus do not work properly or are not present. SIGNS AND SYMPTOMS  Symptoms of acute and chronic sinusitis are the same. The primary symptoms are pain and  pressure around the affected sinuses. Other symptoms include:  Upper toothache.  Earache.  Headache.  Bad breath.  Decreased sense of smell and taste.  A cough, which worsens when you are lying flat.  Fatigue.  Fever.  Thick drainage from your nose, which often is green and may contain pus (purulent).  Swelling and warmth over the affected sinuses. DIAGNOSIS  Your health care provider will perform a physical exam. During the exam, your health care provider may:  Look in your nose for signs of abnormal growths in your nostrils (nasal polyps).  Tap over the affected sinus to check for signs of infection.  View the inside of your sinuses (endoscopy) using an imaging device that has a light attached (endoscope). If your health care provider suspects that you have chronic sinusitis, one or more of the following tests may be recommended:  Allergy tests.  Nasal culture. A sample of mucus is taken from your nose, sent to a lab, and screened for bacteria.  Nasal cytology. A sample of mucus is taken from your nose and examined by your health care provider to determine if your sinusitis is related to an allergy. TREATMENT  Most cases of acute sinusitis are related to a viral infection and will resolve on their own within 10 days. Sometimes medicines are prescribed to help relieve symptoms (pain medicine, decongestants, nasal steroid sprays, or saline sprays).  However, for sinusitis related to a bacterial infection, your health care provider will prescribe antibiotic medicines. These are medicines that will help kill the bacteria causing the infection.  Rarely, sinusitis is caused by a fungal infection. In theses cases, your health care provider will prescribe antifungal medicine. For some cases of chronic sinusitis, surgery is needed. Generally, these are cases in which sinusitis recurs more than 3 times per year, despite other treatments. HOME CARE INSTRUCTIONS   Drink plenty of  water. Water helps thin the mucus so your sinuses can drain more easily.  Use a humidifier.  Inhale steam 3 to 4 times a day (for example, sit in the bathroom with the shower running).  Apply a warm, moist washcloth to your face 3 to 4 times a day, or as directed by your health care provider.  Use saline nasal sprays to help moisten and clean your sinuses.  Take medicines only as directed by your health care provider.  If you were prescribed either an antibiotic or antifungal medicine, finish it all even if you start to feel better. SEEK IMMEDIATE MEDICAL CARE IF:  You have increasing pain or severe headaches.  You have nausea, vomiting, or drowsiness.  You have swelling around your face.  You have vision problems.  You have a stiff neck.  You have difficulty breathing. MAKE SURE YOU:   Understand these instructions.  Will watch your condition.  Will get help right away if you are not doing well or get worse. Document Released: 05/31/2005 Document Revised: 10/15/2013 Document Reviewed: 06/15/2011 [facility] Patient Information 2015 [place], [place]. This information is not intended to replace advice given to you by your health care provider. Make sure you discuss any questions you have  with your health care provider.  

## 2014-08-01 LAB — LIPASE: Lipase: 64 U/L (ref 0–75)

## 2014-08-08 ENCOUNTER — Ambulatory Visit (INDEPENDENT_AMBULATORY_CARE_PROVIDER_SITE_OTHER): Payer: Commercial Indemnity | Admitting: Family Medicine

## 2014-08-08 VITALS — BP 100/68 | HR 80 | Temp 97.7°F | Resp 18 | Ht 59.75 in | Wt 116.0 lb

## 2014-08-08 DIAGNOSIS — G479 Sleep disorder, unspecified: Secondary | ICD-10-CM

## 2014-08-08 DIAGNOSIS — R1013 Epigastric pain: Secondary | ICD-10-CM

## 2014-08-08 MED ORDER — PANTOPRAZOLE SODIUM 40 MG PO TBEC: 40.0000 mg | DELAYED_RELEASE_TABLET | Freq: Every day | ORAL | Status: DC

## 2014-08-08 NOTE — Progress Notes (Signed)
Subjective: 29 year old man who is here couple of weeks ago with abdominal pain. His amylase came back slightly high at that time, though it was repeated last week and was better. He has been taking over-the-counter omeprazole, but still has some burning pain in his stomach. It bothers him at night when he lays down, making it difficult for him to go to sleep. It does not tend to wake him up in the night. He has daytime a generalized mid and upper abdominal burning pain. He has taken some ibuprofen for pains in the past, but it is been a couple of weeks since he has had any and takes that rarely. He has not been having nausea vomiting or bowel changes. No urinary complaints. He does get a little pain in the mid back. He is a Water quality scientistT programmer. He does not smoke. He has not drunk any alcohol since the episode a couple of weeks ago. He had been told in the past that a lot of coughing might be causing some of his symptoms. He had quit previously. He has quit again.   Objective: No major distress. Abdomen soft without organomegaly or masses. No significant tenderness to palpation at this time.  Assessment: Dyspepsia and abdominal pain with some back pain  Plan: Increase to Protonix 40 mg daily from his OTC omeprazole dose. Check H. pylori. If he is not doing better he would need to be referred to gastroenterology for consideration for an EGD.

## 2014-08-08 NOTE — Patient Instructions (Addendum)
Avoid excessive caffeine  Get some regular walking to relieve stress  Take  pantoprazole one daily to reduce stomach acid production  Avoid alcohol. It can trigger pancreatitis.  Avoid ibuprofen or Aleve type products. If needed for pain take Tylenol 2 tablets maximum 3 times daily.  If you're not improving, contact us to make referral to a gastroenterologist.  Take diphenhydramine 25-50 mg at bedtime if needed for sleep. This is marketed under numerous brand names, Benadryl being the most common 1.

## 2014-08-09 LAB — H. PYLORI BREATH TEST: H. PYLORI BREATH TEST: NOT DETECTED

## 2014-08-13 ENCOUNTER — Encounter: Payer: Self-pay | Admitting: Family Medicine

## 2018-02-14 ENCOUNTER — Other Ambulatory Visit: Payer: Self-pay | Admitting: Otolaryngology

## 2018-02-16 ENCOUNTER — Other Ambulatory Visit: Payer: Self-pay | Admitting: Otolaryngology

## 2018-02-16 DIAGNOSIS — H905 Unspecified sensorineural hearing loss: Secondary | ICD-10-CM

## 2018-02-16 DIAGNOSIS — H903 Sensorineural hearing loss, bilateral: Secondary | ICD-10-CM

## 2018-02-27 ENCOUNTER — Ambulatory Visit
Admission: RE | Admit: 2018-02-27 | Discharge: 2018-02-27 | Disposition: A | Discharge status: Home / Self Care | Payer: 59 | Source: Ambulatory Visit | Attending: Otolaryngology | Admitting: Otolaryngology

## 2018-02-27 DIAGNOSIS — H903 Sensorineural hearing loss, bilateral: Secondary | ICD-10-CM

## 2018-02-27 DIAGNOSIS — H905 Unspecified sensorineural hearing loss: Secondary | ICD-10-CM

## 2018-02-27 MED ORDER — GADOBENATE DIMEGLUMINE 529 MG/ML IV SOLN
10.0000 mL | Freq: Once | INTRAVENOUS | Status: AC | PRN
  Administered 2018-02-27: 10 mL via INTRAVENOUS

## 2018-06-19 ENCOUNTER — Ambulatory Visit (INDEPENDENT_AMBULATORY_CARE_PROVIDER_SITE_OTHER): Payer: 59 | Admitting: Otolaryngology

## 2018-07-02 ENCOUNTER — Emergency Department
Admission: EM | Admit: 2018-07-02 | Discharge: 2018-07-02 | Disposition: A | Discharge status: Home / Self Care | Payer: 59 | Attending: Emergency Medicine | Admitting: Emergency Medicine

## 2018-07-02 ENCOUNTER — Encounter: Payer: Self-pay | Admitting: Emergency Medicine

## 2018-07-02 ENCOUNTER — Emergency Department: Payer: 59

## 2018-07-02 DIAGNOSIS — G4700 Insomnia, unspecified: Secondary | ICD-10-CM | POA: Diagnosis not present

## 2018-07-02 DIAGNOSIS — H9312 Tinnitus, left ear: Secondary | ICD-10-CM | POA: Diagnosis not present

## 2018-07-02 DIAGNOSIS — R05 Cough: Secondary | ICD-10-CM | POA: Diagnosis present

## 2018-07-02 DIAGNOSIS — J181 Lobar pneumonia, unspecified organism: Secondary | ICD-10-CM | POA: Insufficient documentation

## 2018-07-02 DIAGNOSIS — Z79899 Other long term (current) drug therapy: Secondary | ICD-10-CM | POA: Insufficient documentation

## 2018-07-02 DIAGNOSIS — J189 Pneumonia, unspecified organism: Secondary | ICD-10-CM

## 2018-07-02 MED ORDER — LIDOCAINE HCL (PF) 1 % IJ SOLN
2.1000 mL | Freq: Once | INTRAMUSCULAR | Status: AC
  Administered 2018-07-02: 2.1 mL
  Filled 2018-07-02: qty 5

## 2018-07-02 MED ORDER — ZOLPIDEM TARTRATE ER 12.5 MG PO TBCR: 12.5000 mg | EXTENDED_RELEASE_TABLET | Freq: Every evening | ORAL | 1 refills | Status: AC | PRN

## 2018-07-02 MED ORDER — CEFTRIAXONE SODIUM 1 G IJ SOLR
1.0000 g | Freq: Once | INTRAMUSCULAR | Status: AC
  Administered 2018-07-02: 1 g via INTRAMUSCULAR
  Filled 2018-07-02: qty 10

## 2018-07-02 MED ORDER — AZITHROMYCIN 250 MG PO TABS: ORAL_TABLET | ORAL | 0 refills | Status: DC

## 2018-07-02 NOTE — ED Triage Notes (Signed)
Pt to ED with c/o of cough that has been ongoing for approx 10 days. Pt states worse at night and unable to sleep. Pt afebrile in triage.

## 2018-07-02 NOTE — ED Provider Notes (Signed)
[facility] Emergency Department Provider Note  ____________________________________________  Time seen: Approximately 7:28 PM  I have reviewed the triage vital signs and the nursing notes.   HISTORY  Chief Complaint Cough    HPI Keith Hines is a 33 y.o. male who presents the emergency department with multiple complaints.  Main patient's main complaint at this time is concerned that he may have pneumonia.  Patient has had low-grade fevers, cough x10 days.  Patient started off with URI symptoms, then progressed to only coughing.  Patient is concerned he may have pneumonia at this time.  Patient is also reporting some chronic issues.  Patient had an ear infection with spontaneous rupture of the TM approximately 6 months ago.  Since then, patient has tinnitus, difficulty sleeping.  Patient is being seen by ENT as well as audiology.  On MRI, there was bony lesions identified that are again to be further evaluated with a CT scan.  Patient reports that he is more concerned about the insomnia from his tinnitus that he has about the actual ear complaint as he has specialist following this.  Patient is requesting medication to help him sleep as he states that he sleeps less than 1 to 2 hours a night.    Past Medical History:  Diagnosis Date  . Syncope     Patient Active Problem List   Diagnosis Date Noted  . GERD (gastroesophageal reflux disease) 11/13/2010  . Fatigue 11/13/2010  . Dyspnea 11/13/2010    Past Surgical History:  Procedure Laterality Date  . TYMPANOPLASTY      Prior to Admission medications   Medication Sig Start Date End Date Taking? Authorizing Provider  azithromycin (ZITHROMAX Z-PAK) 250 MG tablet Take 2 tablets (500 mg) on  Day 1,  followed by 1 tablet (250 mg) once daily on Days 2 through 5. 07/02/18   Adonis Ryther, Delorise Royals, PA-C  fluticasone (FLONASE) 50 MCG/ACT nasal spray Place 2 sprays into both nostrils daily. 07/24/14   Shade Flood, MD   Ibuprofen-Diphenhydramine Cit (IBUPROFEN PM) 200-38 MG TABS Take 1 tablet by mouth at bedtime as needed (sleep/ pain).    [provider]  levofloxacin (LEVAQUIN) 500 MG tablet Take 1 tablet (500 mg total) by mouth daily. Patient not taking: Reported on 08/08/2014 07/31/14   Shade Flood, MD  Multiple Vitamin (MULTIVITAMIN WITH MINERALS) TABS tablet Take 1 tablet by mouth daily.    [provider]  omeprazole (PRILOSEC) 20 MG capsule Take 20 mg by mouth daily.    [provider]  pantoprazole (PROTONIX) 40 MG tablet Take 1 tablet (40 mg total) by mouth daily. 08/08/14   Peyton Najjar, MD  zolpidem (AMBIEN CR) 12.5 MG CR tablet Take 1 tablet (12.5 mg total) by mouth at bedtime as needed for sleep. 07/02/18   Jadarious Dobbins, Delorise Royals, PA-C    Allergies Patient has no known allergies.  Family History  Problem Relation Age of Onset  . Arthritis Mother   . Arthritis Father   . Hyperlipidemia Father     Social History Social History   Tobacco Use  . Smoking status: Never Smoker  . Smokeless tobacco: Never Used  Substance Use Topics  . Alcohol use: No    Alcohol/week: 0.0 standard drinks  . Drug use: No     Review of Systems  Constitutional: Low-grade fever/chills Eyes: No visual changes. No discharge ENT: Tinnitus of the left ear Cardiovascular: no chest pain. Respiratory: Positive cough. No SOB. Gastrointestinal: No abdominal  pain.  No nausea, no vomiting.  No diarrhea.  No constipation. Musculoskeletal: Negative for musculoskeletal pain. Skin: Negative for rash, abrasions, lacerations, ecchymosis. Neurological: Negative for headaches, focal weakness or numbness. Psychological: Patient endorses anxiety and concern about health.  He is having insomnia. 10-point ROS otherwise negative.  ____________________________________________   PHYSICAL EXAM:  VITAL SIGNS: ED Triage Vitals  Enc Vitals Group     BP 07/02/18 1717 127/74     Pulse --       Resp 07/02/18 1717 18     Temp 07/02/18 1717 98.6 F (37 C)     Temp Source 07/02/18 1717 Oral     SpO2 07/02/18 1717 99 %     Weight 07/02/18 1718 124 lb (56.2 kg)     Height 07/02/18 1718 5' (1.524 m)     Head Circumference --      Peak Flow --      Pain Score 07/02/18 1717 0     Pain Loc --      Pain Edu? --      Excl. in GC? --      Constitutional: Alert and oriented. Well appearing and in no acute distress. Eyes: Conjunctivae are normal. PERRL. EOMI. Head: Atraumatic. ENT:      Ears: Visualization of bilateral EACs is unremarkable.  Visualization of the left TM reveals old appearing tympanic membrane rupture.  No drainage noted at this time.      Nose: No congestion/rhinnorhea.      Mouth/Throat: Mucous membranes are moist.  Neck: No stridor.   Cardiovascular: Normal rate, regular rhythm. Normal S1 and S2.  Good peripheral circulation. Respiratory: Normal respiratory effort without tachypnea or retractions. Lungs with crackles and coarse breath sounds right middle lobe.  No other adventitious lung sounds.Peri [place]. Good air entry to the bases with no decreased or absent breath sounds. Musculoskeletal: Full range of motion to all extremities. No gross deformities appreciated. Neurologic:  Normal speech and language. No gross focal neurologic deficits are appreciated.  Skin:  Skin is warm, dry and intact. No rash noted. Psychiatric: Mood and affect are normal. Speech and behavior are normal. Patient exhibits appropriate insight and judgement.  No SI or HI.   ____________________________________________   LABS (all labs ordered are listed, but only abnormal results are displayed)  Labs Reviewed - No data to display ____________________________________________  EKG   ____________________________________________  RADIOLOGY I personally viewed and evaluated these images as part of my medical decision making, as well as reviewing the written report by the radiologist.  Dg Chest 2  View  Result Date: 07/02/2018 CLINICAL DATA:  Cough for 10 days. EXAM: CHEST - 2 VIEW COMPARISON:  None. FINDINGS: The heart size and mediastinal contours are within normal limits. Both lungs are clear. The visualized skeletal structures are unremarkable. IMPRESSION: No active cardiopulmonary disease. Electronically Signed   By: Gerome Samavid  Williams III M.D   On: 07/02/2018 18:59    ____________________________________________    PROCEDURES  Procedure(s) performed:    Procedures    Medications  cefTRIAXone (ROCEPHIN) injection 1 g (has no administration in time range)  lidocaine (PF) (XYLOCAINE) 1 % injection 2.1 mL (has no administration in time range)     ____________________________________________   INITIAL IMPRESSION / ASSESSMENT AND PLAN / ED COURSE  Pertinent labs & imaging results that were available during my care of the patient were reviewed by me and considered in my medical decision making (see chart for details).  Review of the [place] CSRS was performed  in accordance of the [facility] prior to dispensing any controlled drugs.      Patient's diagnosis is consistent with community-acquired pneumonia, insomnia, tendinitis.  Patient presents emergency department multiple complaints.  Major concern at this time is possible pneumonia and insomnia.  Findings on exam and patient's history are consistent with mild community-acquired pneumonia even with reassuring chest x-ray.  Patient will be placed on Ambien for insomnia.  He is to follow-up with ENT and audiology for further treatment of tinnitus issues...  Patient is given ED precautions to return to the ED for any worsening or new symptoms.     ____________________________________________  FINAL CLINICAL IMPRESSION(S) / ED DIAGNOSES  Final diagnoses:  Community acquired pneumonia of right middle lobe of lung (HCC)  Insomnia, unspecified type  Tinnitus of left ear      NEW MEDICATIONS STARTED DURING THIS VISIT:  ED  Discharge Orders         Ordered    zolpidem (AMBIEN CR) 12.5 MG CR tablet  At bedtime PRN     07/02/18 2031    azithromycin (ZITHROMAX Z-PAK) 250 MG tablet     07/02/18 2031              This chart was dictated using voice recognition software/Dragon. Despite best efforts to proofread, errors can occur which can change the meaning. Any change was purely unintentional.    Racheal Patches, PA-C 07/02/18 2035    Jeanmarie Plant, MD 07/02/18 [phone]

## 2018-12-08 ENCOUNTER — Other Ambulatory Visit: Payer: Self-pay | Admitting: Critical Care Medicine

## 2018-12-14 LAB — NOVEL CORONAVIRUS, NAA: SARS-CoV-2, NAA: NOT DETECTED

## 2020-07-09 ENCOUNTER — Other Ambulatory Visit: Payer: Self-pay

## 2020-07-09 ENCOUNTER — Ambulatory Visit (INDEPENDENT_AMBULATORY_CARE_PROVIDER_SITE_OTHER): Payer: 59

## 2020-07-09 ENCOUNTER — Encounter: Payer: Self-pay | Admitting: Pulmonary Disease

## 2020-07-09 ENCOUNTER — Ambulatory Visit: Payer: 59 | Admitting: Pulmonary Disease

## 2020-07-09 VITALS — BP 116/66 | HR 92 | Temp 97.5°F | Ht 60.0 in | Wt 132.6 lb

## 2020-07-09 DIAGNOSIS — R059 Cough, unspecified: Secondary | ICD-10-CM

## 2020-07-09 LAB — CBC WITH DIFFERENTIAL/PLATELET
Basophils Absolute: 0 10*3/uL (ref 0.0–0.1)
Basophils Relative: 0.8 % (ref 0.0–3.0)
Eosinophils Absolute: 0.1 10*3/uL (ref 0.0–0.7)
Eosinophils Relative: 2.4 % (ref 0.0–5.0)
HCT: 45.3 % (ref 39.0–52.0)
Hemoglobin: 15.2 g/dL (ref 13.0–17.0)
Lymphocytes Relative: 40.9 % (ref 12.0–46.0)
Lymphs Abs: 1.6 10*3/uL (ref 0.7–4.0)
MCHC: 33.4 g/dL (ref 30.0–36.0)
MCV: 87.9 fl (ref 78.0–100.0)
Monocytes Absolute: 0.3 10*3/uL (ref 0.1–1.0)
Monocytes Relative: 7.7 % (ref 3.0–12.0)
Neutro Abs: 1.9 10*3/uL (ref 1.4–7.7)
Neutrophils Relative %: 48.2 % (ref 43.0–77.0)
Platelets: 261 10*3/uL (ref 150.0–400.0)
RBC: 5.16 Mil/uL (ref 4.22–5.81)
RDW: 12.5 % (ref 11.5–15.5)
WBC: 4 10*3/uL (ref 4.0–10.5)

## 2020-07-09 NOTE — Progress Notes (Signed)
Keith Hines    [id number]    May 23, 1986  Primary Care Physician:Sun, Charyl Dancer, MD  Referring Physician: Salli Real, MD [address] [place],  [place] [address]  Chief complaint: Consult for cough  HPI: 35 year old with coughing for about a month.  Started after he got his COVID booster. Complains of nasal congestion, sinus pressure, cough.  He was evaluated at urgent care on 1/9 and treated with cough syrup and azithromycin.  Rapid Covid test was negative. He also had a chest x-ray at his primary care which was reportedly unremarkable.  Other complaints include chest soreness for the past 5 months, worsened with movement. Has intermittent GERD symptoms with bloating, gas formation at night.  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 He also has history of GERD, H. pylori infection diagnosed by breath assay.  He is to follow with Dr. Loreta Ave, gastroenterology. And was on Prilosec in the past  Pets: Dog Occupation: Radio producer at [employer]: No mold, hot tub, Jacuzzi.  No feather pillows or comforters Smoking history: Never smoker Travel history: Immigrated from [place] at age 84.  No significant recent travel Relevant family history: Parents have chronic bronchitis, asthma, possible emphysema  Outpatient Encounter Medications as of 07/09/2020  Medication Sig  . Multiple Vitamin (MULTIVITAMIN WITH MINERALS) TABS tablet Take 1 tablet by mouth daily.  . fluticasone (FLONASE) 50 MCG/ACT nasal spray Place 2 sprays into both nostrils daily. (Patient not taking: Reported on 07/09/2020)  . zolpidem (AMBIEN CR) 12.5 MG CR tablet Take 1 tablet (12.5 mg total) by mouth at bedtime as needed for sleep. (Patient not taking: Reported on 07/09/2020)  . [DISCONTINUED] azithromycin (ZITHROMAX Z-PAK) 250 MG tablet Take 2 tablets (500 mg) on   Day 1,  followed by 1 tablet (250 mg) once daily on Days 2 through 5.  . [DISCONTINUED] Ibuprofen-Diphenhydramine Cit (IBUPROFEN PM) 200-38 MG TABS Take 1 tablet by mouth at bedtime as needed (sleep/ pain).  . [DISCONTINUED] levofloxacin (LEVAQUIN) 500 MG tablet Take 1 tablet (500 mg total) by mouth daily. (Patient not taking: Reported on 08/08/2014)  . [DISCONTINUED] omeprazole (PRILOSEC) 20 MG capsule Take 20 mg by mouth daily.  . [DISCONTINUED] pantoprazole (PROTONIX) 40 MG tablet Take 1 tablet (40 mg total) by mouth daily.   No facility-administered encounter medications on file as of 07/09/2020.    Allergies as of 07/09/2020  . (No Known Allergies)    Past Medical History:  Diagnosis Date  . Syncope     Past Surgical History:  Procedure Laterality Date  . TYMPANOPLASTY      Family History  Problem Relation Age of Onset  . Arthritis Mother   . Arthritis Father   . Hyperlipidemia Father     Social History   Socioeconomic History  . Marital status: Single    Spouse name: Not on file  . Number of children: Not on file  . Years of education: Not on file  . Highest education level: Not on file  Occupational History  . Not on file  Tobacco Use  . Smoking status: Never Smoker  . Smokeless tobacco: Never Used  Substance and Sexual Activity  . Alcohol use: No    Alcohol/week: 0.0 standard drinks  . Drug use: No  . Sexual activity: Never    Birth control/protection: Abstinence  Other Topics  Concern  . Not on file  Social History Narrative  . Not on file   Social Determinants of Health   Financial Resource Strain: Not on file  Food Insecurity: Not on file  Transportation Needs: Not on file  Physical Activity: Not on file  Stress: Not on file  Social Connections: Not on file  Intimate Partner Violence: Not on file    Review of systems: Review of Systems  Constitutional: Negative for fever and chills.  HENT: Negative.   Eyes: Negative for blurred vision.   Respiratory: as per HPI  Cardiovascular: Negative for chest pain and palpitations.  Gastrointestinal: Negative for vomiting, diarrhea, blood per rectum. Genitourinary: Negative for dysuria, urgency, frequency and hematuria.  Musculoskeletal: Negative for myalgias, back pain and joint pain.  Skin: Negative for itching and rash.  Neurological: Negative for dizziness, tremors, focal weakness, seizures and loss of consciousness.  Endo/Heme/Allergies: Negative for environmental allergies.  Psychiatric/Behavioral: Negative for depression, suicidal ideas and hallucinations.  All other systems reviewed and are negative.  Physical Exam: Blood pressure 116/66, pulse 92, temperature (!) 97.5 F (36.4 C), temperature source Skin, height 5' (1.524 m), weight 132 lb 9.6 oz (60.1 kg), SpO2 95 %. Gen:      No acute distress HEENT:  EOMI, sclera anicteric Neck:     No masses; no thyromegaly Lungs:    Clear to auscultation bilaterally; normal respiratory effort CV:         Regular rate and rhythm; no murmurs Abd:      + bowel sounds; soft, non-tender; no palpable masses, no distension Ext:    No edema; adequate peripheral perfusion Skin:      Warm and dry; no rash Neuro: alert and oriented x 3 Psych: normal mood and affect  Data Reviewed: Imaging:  PFTs:  Labs:  Assessment:  Cough, bronchitis Recently treated with Z-Pak.  Symptoms started after COVID-19 booster Overall he is improving.  May have underlying reactive airway disease.  Check CBC differential, IgE, chest x-ray Schedule pulmonary function test Discussed trial of inhaler but he would like to hold off for now  GERD, H. pylori infection Currently not on treatment.  Given recurrence of bloating, indigestion I have asked him to follow-up with Dr. Loreta Ave again.  Plan/Recommendations: CBC, IgE Chest x-ray, PFTs  Chilton Greathouse MD [facility] 07/09/2020, 9:24 AM  CC: Salli Real, MD

## 2020-07-09 NOTE — Patient Instructions (Addendum)
Check CBC with differential, IgE, chest x-ray today Schedule pulmonary function test Follow-up in clinic in 1 to 2 months.

## 2020-07-10 LAB — IGE: IgE (Immunoglobulin E), Serum: 20 kU/L (ref <=114)

## 2020-07-11 ENCOUNTER — Telehealth: Payer: Self-pay | Admitting: Internal Medicine

## 2020-07-11 NOTE — Telephone Encounter (Signed)
Pt has had the Moderna vaccine and the booster.

## 2020-07-21 ENCOUNTER — Telehealth: Payer: Self-pay | Admitting: Pulmonary Disease

## 2020-07-21 NOTE — Telephone Encounter (Signed)
Called and spoke with Patient.  Results and recommendations given from CXR and labs. Patient stated his chest still feels the same as when he was seen 07/09/20. Patient stated his chest is still sore, and is concerned he needs antibiotic, or something to help with soreness. Patient is scheduled PFT and OV, with Dr. Isaiah Serge, 08/27/20.  Message routed to Dr. Isaiah Serge to advise

## 2020-07-22 NOTE — Telephone Encounter (Signed)
He as treated with antibiotics recently and don't feel he will need more as there is no evidence of pneumonia  We can try an inhaler symbicort 80/4.5 if he wants. He did not want it started at last office visit

## 2020-07-22 NOTE — Telephone Encounter (Signed)
Called and spoke with pt and he stated that he didn't feel that the inhaler would help with the pain that he is having.  He stated that it does not feel like this is in his lung.  He is going to try and get my chart set up so that he can look at everything in there as far as his results.

## 2020-08-23 ENCOUNTER — Other Ambulatory Visit (HOSPITAL_COMMUNITY): Payer: 59

## 2020-08-27 ENCOUNTER — Ambulatory Visit: Payer: 59 | Admitting: Pulmonary Disease

## 2020-09-16 ENCOUNTER — Other Ambulatory Visit (HOSPITAL_COMMUNITY): Payer: 59

## 2020-09-17 ENCOUNTER — Other Ambulatory Visit (HOSPITAL_COMMUNITY)
Admission: RE | Admit: 2020-09-17 | Discharge: 2020-09-17 | Disposition: A | Discharge status: Home / Self Care | Payer: 59 | Source: Ambulatory Visit | Attending: Pulmonary Disease | Admitting: Pulmonary Disease

## 2020-09-17 DIAGNOSIS — Z01812 Encounter for preprocedural laboratory examination: Secondary | ICD-10-CM | POA: Insufficient documentation

## 2020-09-17 DIAGNOSIS — Z20822 Contact with and (suspected) exposure to covid-19: Secondary | ICD-10-CM | POA: Diagnosis not present

## 2020-09-17 LAB — SARS CORONAVIRUS 2 (TAT 6-24 HRS): SARS Coronavirus 2: NEGATIVE

## 2020-09-19 ENCOUNTER — Ambulatory Visit: Payer: 59 | Admitting: Pulmonary Disease

## 2021-05-18 ENCOUNTER — Emergency Department (HOSPITAL_COMMUNITY)
Admission: EM | Admit: 2021-05-18 | Discharge: 2021-05-18 | Disposition: A | Discharge status: Home / Self Care | Payer: Self-pay | Attending: Emergency Medicine | Admitting: Emergency Medicine

## 2021-05-18 ENCOUNTER — Other Ambulatory Visit: Payer: Self-pay

## 2021-05-18 ENCOUNTER — Emergency Department (HOSPITAL_COMMUNITY): Payer: Self-pay

## 2021-05-18 DIAGNOSIS — R059 Cough, unspecified: Secondary | ICD-10-CM

## 2021-05-18 DIAGNOSIS — U071 COVID-19: Secondary | ICD-10-CM | POA: Insufficient documentation

## 2021-05-18 LAB — I-STAT CHEM 8, ED
BUN: 9 mg/dL (ref 6–20)
Calcium, Ion: 1.13 mmol/L — ABNORMAL LOW (ref 1.15–1.40)
Chloride: 103 mmol/L (ref 98–111)
Creatinine, Ser: 0.9 mg/dL (ref 0.61–1.24)
Glucose, Bld: 83 mg/dL (ref 70–99)
HCT: 50 % (ref 39.0–52.0)
Hemoglobin: 17 g/dL (ref 13.0–17.0)
Potassium: 4 mmol/L (ref 3.5–5.1)
Sodium: 140 mmol/L (ref 135–145)
TCO2: 26 mmol/L (ref 22–32)

## 2021-05-18 LAB — RESP PANEL BY RT-PCR (FLU A&B, COVID) ARPGX2
Influenza A by PCR: NEGATIVE
Influenza B by PCR: NEGATIVE
SARS Coronavirus 2 by RT PCR: POSITIVE — AB

## 2021-05-18 MED ORDER — ALBUTEROL SULFATE HFA 108 (90 BASE) MCG/ACT IN AERS
2.0000 | INHALATION_SPRAY | Freq: Once | RESPIRATORY_TRACT | Status: AC
  Administered 2021-05-18: 2 via RESPIRATORY_TRACT
  Filled 2021-05-18: qty 6.7

## 2021-05-18 MED ORDER — GUAIFENESIN-CODEINE 100-10 MG/5ML PO SOLN: 5.0000 mL | Freq: Four times a day (QID) | ORAL | 0 refills | Status: AC | PRN

## 2021-05-18 MED ORDER — IBUPROFEN 400 MG PO TABS
600.0000 mg | ORAL_TABLET | Freq: Once | ORAL | Status: AC
  Administered 2021-05-18: 600 mg via ORAL
  Filled 2021-05-18: qty 1

## 2021-05-18 MED ORDER — AEROCHAMBER PLUS FLO-VU MISC
1.0000 | Freq: Once | Status: AC
  Administered 2021-05-18: 1
  Filled 2021-05-18: qty 1

## 2021-05-18 MED ORDER — PREDNISONE 20 MG PO TABS
60.0000 mg | ORAL_TABLET | Freq: Once | ORAL | Status: AC
  Administered 2021-05-18: 60 mg via ORAL
  Filled 2021-05-18: qty 3

## 2021-05-18 MED ORDER — NIRMATRELVIR/RITONAVIR (PAXLOVID)TABLET: 3.0000 | ORAL_TABLET | Freq: Two times a day (BID) | ORAL | 0 refills | Status: AC

## 2021-05-18 MED ORDER — PREDNISONE 10 MG (21) PO TBPK: ORAL_TABLET | Freq: Every day | ORAL | 0 refills | Status: AC

## 2021-05-18 NOTE — ED Triage Notes (Signed)
Pt reports productive cough x 3 days. Roommate has similar illness. Endorses pain in back and rib cage. Taking Mucinex without relief.

## 2021-05-18 NOTE — ED Provider Notes (Signed)
Emergency Medicine Provider Triage Evaluation Note  Keith Hines , a 35 y.o. male  was evaluated in triage.  Pt complains of cough.  Review of Systems  Positive: cough Negative: fever  Physical Exam  BP 127/80 (BP Location: Left Arm)   Pulse 88   Temp 99.2 F (37.3 C) (Oral)   Resp 16   SpO2 100%  Gen:   Awake, no distress   Resp:  Normal effort  MSK:   Moves extremities without difficulty  Other:  Lungs ctab, heart rrr  Medical Decision Making  Medically screening exam initiated at 5:21 PM.  Appropriate orders placed.  Keith Hines was informed that the remainder of the evaluation will be completed by another provider, this initial triage assessment does not replace that evaluation, and the importance of remaining in the ED until their evaluation is complete.     Rayne Du 05/18/21 1722    Jacalyn Lefevre, MD 05/18/21 1723

## 2021-05-18 NOTE — ED Provider Notes (Signed)
[facility] EMERGENCY DEPARTMENT Provider Note   CSN: [id number] Arrival date & time: 05/18/21  1707     History Chief Complaint  Patient presents with   Cough    Keith Hines is a 35 y.o. male.  Pt presents to the ED today with a cough.  Pt said he was exposed to Covid this weekend at a party.  The person who had it first went to sleep and did not wake up.  This has caused this patient to be scared to go to sleep.  Pt said he's had a cough for 3 days.  He has pain on the right side of his chest.  He's been taking mucinex without improvement in sx.  His roommate is also sick with Covid.  Pt is vaccinated + 2 boosters for Covid.      Past Medical History:  Diagnosis Date   Syncope     Patient Active Problem List   Diagnosis Date Noted   GERD (gastroesophageal reflux disease) 11/13/2010   Fatigue 11/13/2010   Dyspnea 11/13/2010    Past Surgical History:  Procedure Laterality Date   TYMPANOPLASTY         Family History  Problem Relation Age of Onset   Arthritis Mother    Arthritis Father    Hyperlipidemia Father     Social History   Tobacco Use   Smoking status: Never   Smokeless tobacco: Never  Substance Use Topics   Alcohol use: No    Alcohol/week: 0.0 standard drinks   Drug use: No    Home Medications Prior to Admission medications   Medication Sig Start Date End Date Taking? Authorizing Provider  guaiFENesin-codeine 100-10 MG/5ML syrup Take 5 mLs by mouth every 6 (six) hours as needed for cough. 05/18/21  Yes Jacalyn Lefevre, MD  nirmatrelvir/ritonavir EUA (PAXLOVID) 20 x 150 MG & 10 x 100MG  TABS Take 3 tablets by mouth 2 (two) times daily for 5 days. Patient GFR is >60. Take nirmatrelvir (150 mg) two tablets twice daily for 5 days and ritonavir (100 mg) one tablet twice daily for 5 days. 05/18/21 05/23/21 Yes 14/10/22, MD  predniSONE (STERAPRED UNI-PAK 21 TAB) 10 MG (21) TBPK tablet Take by mouth daily. Take 6 tabs by mouth daily   for 2 days, then 5 tabs for 2 days, then 4 tabs for 2 days, then 3 tabs for 2 days, 2 tabs for 2 days, then 1 tab by mouth daily for 2 days 05/18/21  Yes 14/5/22, MD  fluticasone [facility]) 50 MCG/ACT nasal spray Place 2 sprays into both nostrils daily. Patient not taking: Reported on 07/09/2020 07/24/14   09/22/14, MD  Multiple Vitamin (MULTIVITAMIN WITH MINERALS) TABS tablet Take 1 tablet by mouth daily.    [provider]  zolpidem (AMBIEN CR) 12.5 MG CR tablet Take 1 tablet (12.5 mg total) by mouth at bedtime as needed for sleep. Patient not taking: Reported on 07/09/2020 07/02/18   Cuthriell, 07/04/18, PA-C    Allergies    Patient has no known allergies.  Review of Systems   Review of Systems  Respiratory:  Positive for cough.   Cardiovascular:  Positive for chest pain.  All other systems reviewed and are negative.  Physical Exam Updated Vital Signs BP 127/80 (BP Location: Left Arm)   Pulse 88   Temp 99.2 F (37.3 C) (Oral)   Resp 16   SpO2 100%   Physical Exam Vitals and nursing note reviewed.  Constitutional:  Appearance: Normal appearance.  HENT:     Head: Normocephalic and atraumatic.     Right Ear: External ear normal.     Left Ear: External ear normal.     Nose: Nose normal.     Mouth/Throat:     Mouth: Mucous membranes are moist.     Pharynx: Oropharynx is clear.  Eyes:     Extraocular Movements: Extraocular movements intact.     Conjunctiva/sclera: Conjunctivae normal.     Pupils: Pupils are equal, round, and reactive to light.  Cardiovascular:     Rate and Rhythm: Normal rate and regular rhythm.     Pulses: Normal pulses.     Heart sounds: Normal heart sounds.  Pulmonary:     Effort: Pulmonary effort is normal.     Breath sounds: Normal breath sounds.  Abdominal:     General: Abdomen is flat. Bowel sounds are normal.     Palpations: Abdomen is soft.  Musculoskeletal:        General: Normal range of motion.     Cervical  back: Normal range of motion and neck supple.  Skin:    General: Skin is warm.     Capillary Refill: Capillary refill takes less than 2 seconds.  Neurological:     General: No focal deficit present.     Mental Status: He is alert and oriented to person, place, and time.  Psychiatric:        Mood and Affect: Mood normal.        Behavior: Behavior normal.        Thought Content: Thought content normal.        Judgment: Judgment normal.    ED Results / Procedures / Treatments   Labs (all labs ordered are listed, but only abnormal results are displayed) Labs Reviewed  RESP PANEL BY RT-PCR (FLU A&B, COVID) ARPGX2 - Abnormal; Notable for the following components:      Result Value   SARS Coronavirus 2 by RT PCR POSITIVE (*)    All other components within normal limits  I-STAT CHEM 8, ED - Abnormal; Notable for the following components:   Calcium, Ion 1.13 (*)    All other components within normal limits    EKG None  Radiology DG Chest 1 View  Result Date: 05/18/2021 CLINICAL DATA:  Cough. EXAM: CHEST  1 VIEW COMPARISON:  Chest x-ray 07/09/2020. FINDINGS: The heart size and mediastinal contours are within normal limits. Both lungs are clear. The visualized skeletal structures are unremarkable. IMPRESSION: No active disease. Electronically Signed   By: Darliss Cheney M.D.   On: 05/18/2021 18:19    Procedures Procedures   Medications Ordered in ED Medications  albuterol (VENTOLIN HFA) 108 (90 Base) MCG/ACT inhaler 2 puff (2 puffs Inhalation Given 05/18/21 2223)  aerochamber plus with mask device 1 each (1 each Other Given 05/18/21 2223)  ibuprofen (ADVIL) tablet 600 mg (600 mg Oral Given 05/18/21 2215)  predniSONE (DELTASONE) tablet 60 mg (60 mg Oral Given 05/18/21 2212)    ED Course  I have reviewed the triage vital signs and the nursing notes.  Pertinent labs & imaging results that were available during my care of the patient were reviewed by me and considered in my medical  decision making (see chart for details).    MDM Rules/Calculators/A&P                           Pt is + for Covid.  CXR is clear.  He is not hypoxic or tachycardic.  CP likely msk and not PE.  Pt will be d/c with paxlovid and prednisone.  He is given an inhaler + spacer in the ED.  Cr is nl.  Stancil Saperstein was evaluated in Emergency Department on 05/18/2021 for the symptoms described in the history of present illness. He was evaluated in the context of the global COVID-19 pandemic, which necessitated consideration that the patient might be at risk for infection with the SARS-CoV-2 virus that causes COVID-19. Institutional protocols and algorithms that pertain to the evaluation of patients at risk for COVID-19 are in a state of rapid change based on information released by regulatory bodies including the CDC and federal and state organizations. These policies and algorithms were followed during the patient's care in the ED.  Final Clinical Impression(s) / ED Diagnoses Final diagnoses:  Cough  COVID-19    Rx / DC Orders ED Discharge Orders          Ordered    predniSONE (STERAPRED UNI-PAK 21 TAB) 10 MG (21) TBPK tablet  Daily        05/18/21 2206    guaiFENesin-codeine 100-10 MG/5ML syrup  Every 6 hours PRN        05/18/21 2206    nirmatrelvir/ritonavir EUA (PAXLOVID) 20 x 150 MG & 10 x 100MG  TABS  2 times daily        05/18/21 2231             2232, MD 05/18/21 2231

## 2021-05-18 NOTE — ED Notes (Signed)
ED Provider at bedside. 

## 2023-01-31 ENCOUNTER — Other Ambulatory Visit: Payer: Self-pay | Admitting: Rehabilitation

## 2023-01-31 DIAGNOSIS — M5416 Radiculopathy, lumbar region: Secondary | ICD-10-CM

## 2023-02-04 ENCOUNTER — Other Ambulatory Visit: Payer: Self-pay

## 2023-02-07 ENCOUNTER — Encounter: Payer: Self-pay | Admitting: Rehabilitation

## 2023-02-08 ENCOUNTER — Other Ambulatory Visit: Payer: Self-pay

## 2023-02-26 ENCOUNTER — Ambulatory Visit
Admission: RE | Admit: 2023-02-26 | Discharge: 2023-02-26 | Disposition: A | Discharge status: Home / Self Care | Payer: Self-pay | Source: Ambulatory Visit | Attending: Rehabilitation | Admitting: Rehabilitation

## 2023-02-26 DIAGNOSIS — M5416 Radiculopathy, lumbar region: Secondary | ICD-10-CM

## 2023-07-22 IMAGING — CR DG CHEST 1V
1 series · 1 of 1 positions shown · non-contrast
Comparison: Chest x-ray 07/09/2020.

CLINICAL DATA: Cough.

EXAM:
CHEST  1 VIEW

[chest pa]
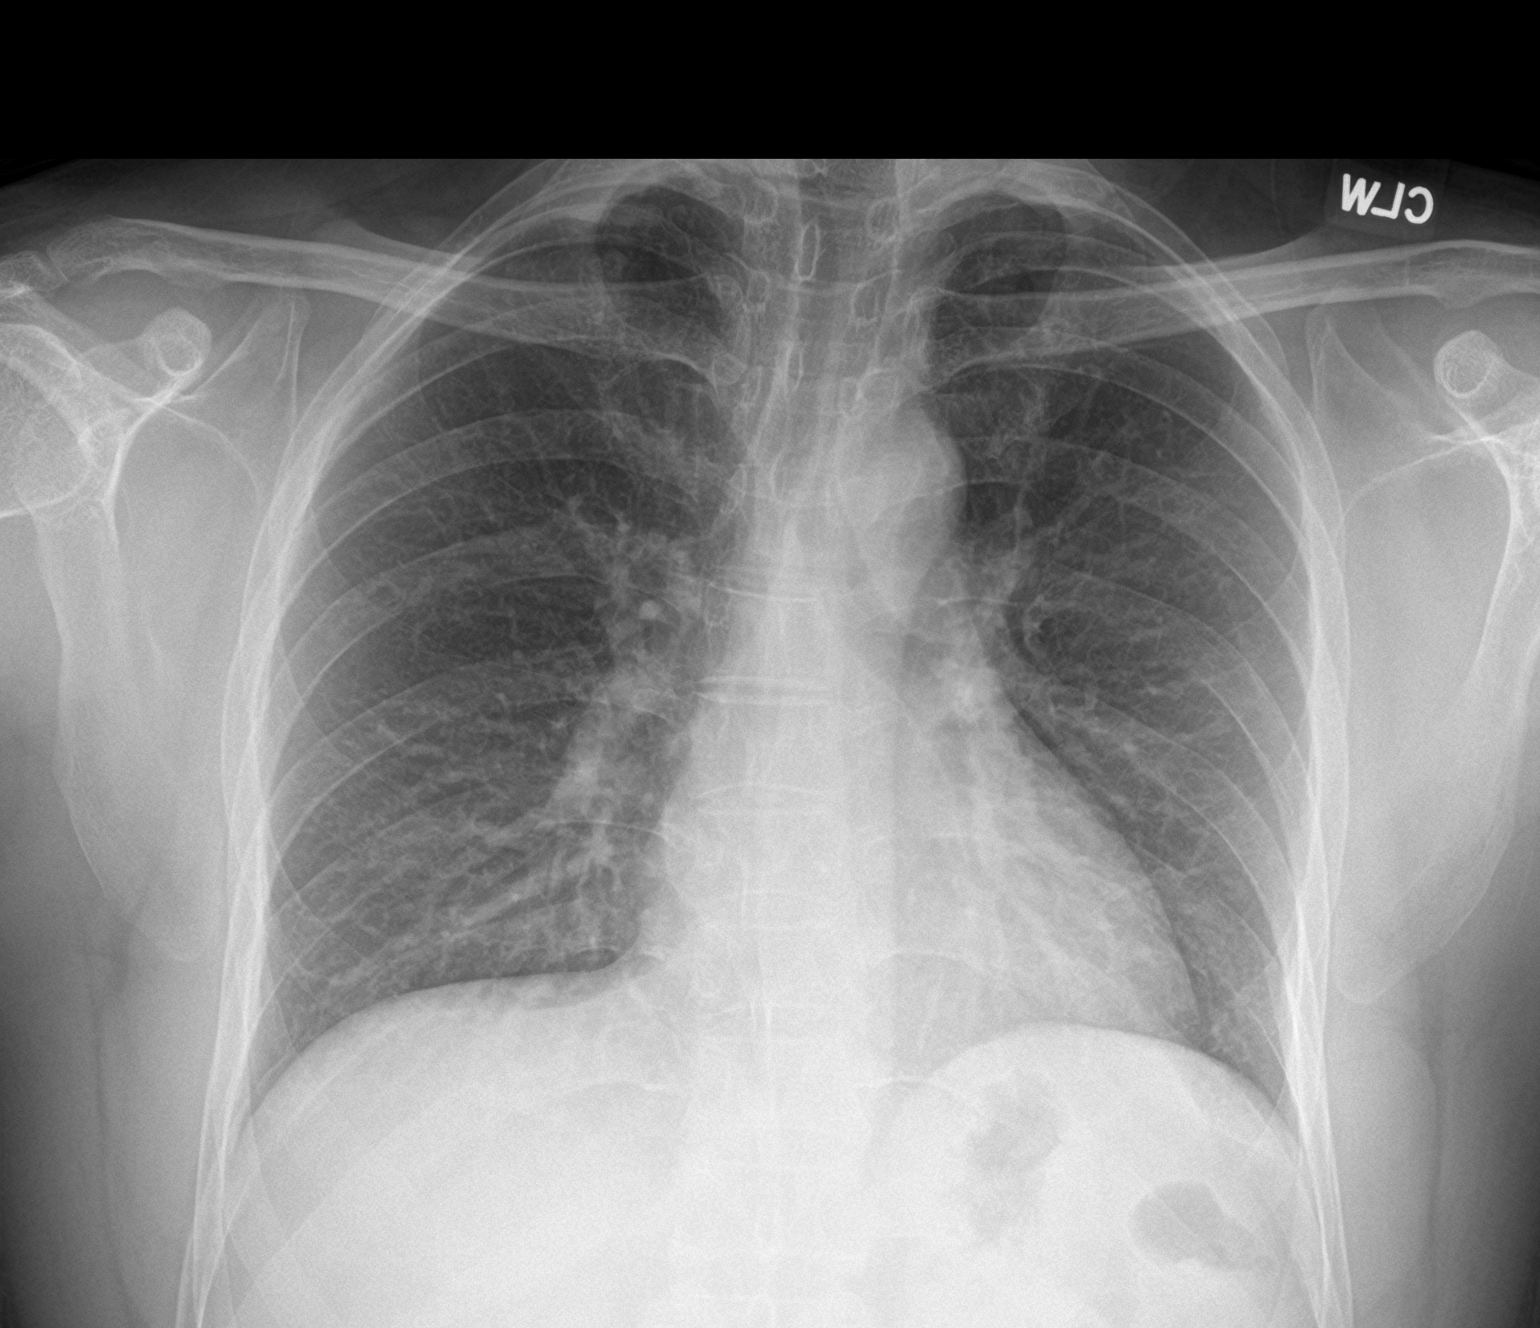

[1 of 1 positions shown; findings below may reference images not displayed]

FINDINGS: The heart size and mediastinal contours are within normal limits.
Both lungs are clear. The visualized skeletal structures are
unremarkable.
IMPRESSION: No active disease.
